# Patient Record
Sex: Male | Born: 1955 | Race: Black or African American | Hispanic: No | Marital: Married | Smoking: Current every day smoker
Health system: Southern US, Community
[De-identification: ages and names within clinical notes are randomized; demographics above are authoritative.]

## PROBLEM LIST (undated history)

## (undated) DIAGNOSIS — E785 Hyperlipidemia, unspecified: Secondary | ICD-10-CM

## (undated) DIAGNOSIS — I10 Essential (primary) hypertension: Secondary | ICD-10-CM

## (undated) DIAGNOSIS — R011 Cardiac murmur, unspecified: Secondary | ICD-10-CM

## (undated) DIAGNOSIS — I509 Heart failure, unspecified: Secondary | ICD-10-CM

## (undated) DIAGNOSIS — J45909 Unspecified asthma, uncomplicated: Secondary | ICD-10-CM

## (undated) HISTORY — DX: Heart failure, unspecified: I50.9

---

## 2010-05-22 ENCOUNTER — Emergency Department: Payer: Self-pay | Admitting: Emergency Medicine

## 2014-04-27 ENCOUNTER — Telehealth: Payer: Self-pay | Admitting: *Deleted

## 2014-04-27 MED ORDER — PREDNISONE 20 MG PO TABS: ORAL_TABLET | ORAL | Status: DC

## 2014-04-27 NOTE — Telephone Encounter (Signed)
Patient called and complained about low back pain that is radiating down his legs, especially the left leg.  Patient is traveling to [place] this Sunday.  Per Dr Oneta RackMcKeown, RX for Prednisone will be sent to [facility].

## 2014-07-03 ENCOUNTER — Other Ambulatory Visit: Payer: Self-pay | Admitting: Internal Medicine

## 2016-09-07 ENCOUNTER — Encounter: Payer: Self-pay | Admitting: Emergency Medicine

## 2016-09-07 ENCOUNTER — Emergency Department: Payer: Self-pay

## 2016-09-07 ENCOUNTER — Inpatient Hospital Stay
Admission: EM | Admit: 2016-09-07 | Discharge: 2016-09-10 | DRG: 190 | Disposition: A | Discharge status: Home / Self Care | Payer: Self-pay | Attending: Internal Medicine | Admitting: Internal Medicine

## 2016-09-07 ENCOUNTER — Inpatient Hospital Stay: Payer: Self-pay

## 2016-09-07 DIAGNOSIS — Z7982 Long term (current) use of aspirin: Secondary | ICD-10-CM

## 2016-09-07 DIAGNOSIS — I5031 Acute diastolic (congestive) heart failure: Secondary | ICD-10-CM | POA: Diagnosis present

## 2016-09-07 DIAGNOSIS — I110 Hypertensive heart disease with heart failure: Secondary | ICD-10-CM | POA: Diagnosis present

## 2016-09-07 DIAGNOSIS — J40 Bronchitis, not specified as acute or chronic: Secondary | ICD-10-CM

## 2016-09-07 DIAGNOSIS — I248 Other forms of acute ischemic heart disease: Secondary | ICD-10-CM | POA: Diagnosis present

## 2016-09-07 DIAGNOSIS — J441 Chronic obstructive pulmonary disease with (acute) exacerbation: Principal | ICD-10-CM | POA: Diagnosis present

## 2016-09-07 DIAGNOSIS — E785 Hyperlipidemia, unspecified: Secondary | ICD-10-CM | POA: Diagnosis present

## 2016-09-07 DIAGNOSIS — Z841 Family history of disorders of kidney and ureter: Secondary | ICD-10-CM

## 2016-09-07 DIAGNOSIS — Z833 Family history of diabetes mellitus: Secondary | ICD-10-CM

## 2016-09-07 DIAGNOSIS — F17210 Nicotine dependence, cigarettes, uncomplicated: Secondary | ICD-10-CM | POA: Diagnosis present

## 2016-09-07 DIAGNOSIS — Z79899 Other long term (current) drug therapy: Secondary | ICD-10-CM

## 2016-09-07 DIAGNOSIS — I272 Pulmonary hypertension, unspecified: Secondary | ICD-10-CM | POA: Diagnosis present

## 2016-09-07 DIAGNOSIS — Z8249 Family history of ischemic heart disease and other diseases of the circulatory system: Secondary | ICD-10-CM

## 2016-09-07 DIAGNOSIS — I509 Heart failure, unspecified: Secondary | ICD-10-CM

## 2016-09-07 HISTORY — DX: Essential (primary) hypertension: I10

## 2016-09-07 HISTORY — DX: Hyperlipidemia, unspecified: E78.5

## 2016-09-07 HISTORY — DX: Cardiac murmur, unspecified: R01.1

## 2016-09-07 HISTORY — DX: Unspecified asthma, uncomplicated: J45.909

## 2016-09-07 LAB — BASIC METABOLIC PANEL
ANION GAP: 5 (ref 5–15)
BUN: 12 mg/dL (ref 6–20)
CO2: 31 mmol/L (ref 22–32)
Calcium: 9.1 mg/dL (ref 8.9–10.3)
Chloride: 103 mmol/L (ref 101–111)
Creatinine, Ser: 0.94 mg/dL (ref 0.61–1.24)
GLUCOSE: 117 mg/dL — AB (ref 65–99)
POTASSIUM: 3.5 mmol/L (ref 3.5–5.1)
Sodium: 139 mmol/L (ref 135–145)

## 2016-09-07 LAB — CBC WITH DIFFERENTIAL/PLATELET
BASOS ABS: 0.1 10*3/uL (ref 0–0.1)
Basophils Relative: 1 %
EOS PCT: 3 %
Eosinophils Absolute: 0.4 10*3/uL (ref 0–0.7)
HCT: 42.1 % (ref 40.0–52.0)
Hemoglobin: 13.7 g/dL (ref 13.0–18.0)
LYMPHS PCT: 28 %
Lymphs Abs: 3.6 10*3/uL (ref 1.0–3.6)
MCH: 28.2 pg (ref 26.0–34.0)
MCHC: 32.5 g/dL (ref 32.0–36.0)
MCV: 86.8 fL (ref 80.0–100.0)
Monocytes Absolute: 0.7 10*3/uL (ref 0.2–1.0)
Monocytes Relative: 6 %
NEUTROS ABS: 8.2 10*3/uL — AB (ref 1.4–6.5)
Neutrophils Relative %: 62 %
PLATELETS: 237 10*3/uL (ref 150–440)
RBC: 4.85 MIL/uL (ref 4.40–5.90)
RDW: 14.9 % — ABNORMAL HIGH (ref 11.5–14.5)
WBC: 13.1 10*3/uL — ABNORMAL HIGH (ref 3.8–10.6)

## 2016-09-07 LAB — LIPID PANEL
CHOL/HDL RATIO: 4.9 ratio
Cholesterol: 261 mg/dL — ABNORMAL HIGH (ref 0–200)
HDL: 53 mg/dL (ref >40)
LDL Cholesterol: 194 mg/dL — ABNORMAL HIGH (ref 0–99)
Triglycerides: 71 mg/dL (ref <150)
VLDL: 14 mg/dL (ref 0–40)

## 2016-09-07 LAB — TROPONIN I
TROPONIN I: 0.05 ng/mL — AB (ref <0.03)
Troponin I: 0.03 ng/mL (ref <0.03)
Troponin I: 0.03 ng/mL (ref <0.03)

## 2016-09-07 LAB — BRAIN NATRIURETIC PEPTIDE: B NATRIURETIC PEPTIDE 5: 222 pg/mL — AB (ref 0.0–100.0)

## 2016-09-07 MED ORDER — METHYLPREDNISOLONE SODIUM SUCC 125 MG IJ SOLR
125.0000 mg | Freq: Once | INTRAMUSCULAR | Status: AC
  Administered 2016-09-07: 125 mg via INTRAVENOUS
  Filled 2016-09-07: qty 2

## 2016-09-07 MED ORDER — METOPROLOL SUCCINATE ER 50 MG PO TB24
50.0000 mg | ORAL_TABLET | Freq: Every day | ORAL | Status: DC
  Administered 2016-09-07 – 2016-09-10 (×4): 50 mg via ORAL
  Filled 2016-09-07 (×4): qty 1

## 2016-09-07 MED ORDER — HYDRALAZINE HCL 20 MG/ML IJ SOLN: 10.0000 mg | Freq: Once | INTRAMUSCULAR | Status: DC

## 2016-09-07 MED ORDER — SODIUM CHLORIDE 0.9% FLUSH
3.0000 mL | Freq: Two times a day (BID) | INTRAVENOUS | Status: DC
  Administered 2016-09-07 – 2016-09-09 (×6): 3 mL via INTRAVENOUS

## 2016-09-07 MED ORDER — IPRATROPIUM-ALBUTEROL 0.5-2.5 (3) MG/3ML IN SOLN
3.0000 mL | Freq: Once | RESPIRATORY_TRACT | Status: AC
  Administered 2016-09-07: 3 mL via RESPIRATORY_TRACT

## 2016-09-07 MED ORDER — LEVOFLOXACIN 500 MG PO TABS
500.0000 mg | ORAL_TABLET | Freq: Every day | ORAL | Status: DC
  Administered 2016-09-07 – 2016-09-10 (×4): 500 mg via ORAL
  Filled 2016-09-07 (×4): qty 1

## 2016-09-07 MED ORDER — HEPARIN SODIUM (PORCINE) 5000 UNIT/ML IJ SOLN
5000.0000 [IU] | Freq: Three times a day (TID) | INTRAMUSCULAR | Status: DC
  Administered 2016-09-07 – 2016-09-08 (×3): 5000 [IU] via SUBCUTANEOUS
  Filled 2016-09-07 (×3): qty 1

## 2016-09-07 MED ORDER — SIMVASTATIN 20 MG PO TABS
20.0000 mg | ORAL_TABLET | Freq: Every evening | ORAL | Status: DC
  Administered 2016-09-07: 20 mg via ORAL
  Filled 2016-09-07: qty 1

## 2016-09-07 MED ORDER — FUROSEMIDE 10 MG/ML IJ SOLN
40.0000 mg | Freq: Once | INTRAMUSCULAR | Status: AC
  Administered 2016-09-07: 40 mg via INTRAVENOUS
  Filled 2016-09-07: qty 4

## 2016-09-07 MED ORDER — IPRATROPIUM-ALBUTEROL 0.5-2.5 (3) MG/3ML IN SOLN
3.0000 mL | Freq: Once | RESPIRATORY_TRACT | Status: AC
  Administered 2016-09-07: 3 mL via RESPIRATORY_TRACT
  Filled 2016-09-07: qty 3

## 2016-09-07 MED ORDER — HYDRALAZINE HCL 20 MG/ML IJ SOLN: 10.0000 mg | INTRAMUSCULAR | Status: DC | PRN

## 2016-09-07 MED ORDER — FUROSEMIDE 10 MG/ML IJ SOLN
20.0000 mg | Freq: Two times a day (BID) | INTRAMUSCULAR | Status: DC
  Administered 2016-09-07 – 2016-09-09 (×4): 20 mg via INTRAVENOUS
  Filled 2016-09-07 (×4): qty 2

## 2016-09-07 MED ORDER — ENALAPRIL MALEATE 2.5 MG PO TABS
2.5000 mg | ORAL_TABLET | Freq: Every day | ORAL | Status: DC
  Administered 2016-09-07 – 2016-09-08 (×2): 2.5 mg via ORAL
  Filled 2016-09-07 (×3): qty 1

## 2016-09-07 MED ORDER — IPRATROPIUM-ALBUTEROL 0.5-2.5 (3) MG/3ML IN SOLN
RESPIRATORY_TRACT | Status: AC
  Filled 2016-09-07: qty 9

## 2016-09-07 NOTE — ED Triage Notes (Signed)
Pt presents with sob x 3 weeks with congested/productive cough that has gotten progressively worse. Pt states he has asthma but his inhaler prescription has run out. Denies COPD hx. Pt states he has had nasal congestion/URI symptoms as well. PT working to breathe.

## 2016-09-07 NOTE — ED Notes (Signed)
Dr. Huel CoteQuigley notified of troponin 0.05

## 2016-09-07 NOTE — Care Management Note (Signed)
Case Management Note  Patient Details  Name: Brent CooleyRandy Moosman MRN: [id number] Date of Birth: [birth year]  Subjective/Objective:      Spoke to the pt. At bedside while MD re-examined him. He remains wheezy. I asked him about where he usually gets his inhalers , and he admits he has not had a doctor visit or prescription for some time/ several years. He is willing to take a script he gets here , if discharged [facility] the street to [facility]. I have explained where they are located, and that I'm leaving an application on his chart, which he will get when discharged later this am. The MD is aware and hopeful for d/c after another treatment.       Action/Plan:   Expected Discharge Date:                  Expected Discharge Plan:     In-House Referral:     Discharge planning Services     Post Acute Care Choice:    Choice offered to:     DME Arranged:    DME Agency:     HH Arranged:    HH Agency:     Status of Service:     If discussed at [employer] Length of Stay Meetings, dates discussed:    Additional Comments:  Berna BueCheryl Rumeal Cullipher, RN 09/07/2016, 9:10 AM

## 2016-09-07 NOTE — ED Provider Notes (Signed)
Time Seen: Approximately 0831  I have reviewed the triage notes  Chief Complaint: Shortness of Breath   History of Present Illness: Brent Davidson is a 60 y.o. male who presents with a 3 week history of chest congestion, dry nonproductive cough which is Progressively worse. Patient states he had difficulty sleeping last night. He has had a history of asthma/COPD in the past. He states he does not have an inhaler. He denies any fever or chest pain. He states he's had some mild swelling in his extremities. No history of blood clots in his legs or lungs.   Past Medical History:  Diagnosis Date  . Asthma   . Heart murmur   . Hypertension     There are no active problems to display for this patient.   History reviewed. No pertinent surgical history.  History reviewed. No pertinent surgical history.  Current Outpatient Rx  . Order #: [id number] Class: Historical Med  . Order #: [id number] Class: Historical Med  . Order #: [id number] Class: Historical Med  . Order #: [id number] Class: Historical Med  . Order #: [id number] Class: Historical Med  . Order #: [id number] Class: Historical Med  . Order #: [id number] Class: Normal    Allergies:  Patient has no known allergies.  Family History: History reviewed. No pertinent family history.  Social History: Social History  Substance Use Topics  . Smoking status: Current Every Day Smoker    Packs/day: 0.50  . Smokeless tobacco: Never Used  . Alcohol use No     Comment: occasional     Review of Systems:   10 point review of systems was performed and was otherwise negative:  Constitutional: No Objective fever. He's had some chills and body aches Eyes: No visual disturbances ENT: No sore throat, ear pain Cardiac: No chest pain Respiratory: Increasing shortness of breath. Some audible wheezing at home Abdomen: No abdominal pain, no vomiting, No diarrhea Endocrine: No weight loss, No night sweats Extremities: No persistent peripheral  edema, cyanosis Skin: No rashes, easy bruising Neurologic: No focal weakness, trouble with speech or swollowing Urologic: No dysuria, Hematuria, or urinary frequency   Physical Exam:  ED Triage Vitals  Enc Vitals Group     BP 09/07/16 0757 (!) 193/97     Pulse Rate 09/07/16 0757 85     Resp 09/07/16 0757 (!) 22     Temp 09/07/16 0757 97.6 F (36.4 C)     Temp Source 09/07/16 0757 Oral     SpO2 09/07/16 0757 96 %     Weight 09/07/16 0757 210 lb (95.3 kg)     Height 09/07/16 0757 5\' 5"  (1.651 m)     Head Circumference --      Peak Flow --      Pain Score 09/07/16 0758 5     Pain Loc --      Pain Edu? --      Excl. in GC? --     General: Awake , Alert , and Oriented times 3; GCS 15 Tachypnea. Speaks in interrupted sentences Head: Normal cephalic , atraumatic Eyes: Pupils equal , round, reactive to light Nose/Throat: No nasal drainage, patent upper airway without erythema or exudate.  Neck: Supple, Full range of motion, No anterior adenopathy or palpable thyroid masses Lungs: Coarse breath sounds auscultated in all lung fields especially with wheezing heard bilaterally at the apices  Heart: Regular rate, regular rhythm without murmurs , gallops , or rubs Abdomen: Soft, non tender without rebound, guarding , or rigidity; bowel sounds positive and  symmetric in all 4 quadrants. No organomegaly .        Extremities: 2 plus symmetric pulses. No edema, clubbing or cyanosis Neurologic: normal ambulation, Motor symmetric without deficits, sensory intact Skin: warm, dry, no rashes   Labs:   All laboratory work was reviewed including any pertinent negatives or positives listed below:  Labs Reviewed  CBC WITH DIFFERENTIAL/PLATELET - Abnormal; Notable for the following:       Result Value   WBC 13.1 (*)    RDW 14.9 (*)    Neutro Abs 8.2 (*)    All other components within normal limits  BASIC METABOLIC PANEL - Abnormal; Notable for the following:    Glucose, Bld 117 (*)    All  other components within normal limits  BRAIN NATRIURETIC PEPTIDE - Abnormal; Notable for the following:    B Natriuretic Peptide 222.0 (*)    All other components within normal limits  TROPONIN I - Abnormal; Notable for the following:    Troponin I 0.05 (*)    All other components within normal limits  Reviewed the patient's laboratory work shows an elevated BNP and troponin  EKG:  ED ECG REPORT I, Jennye MoccasinBrian S Sairah Knobloch, the attending physician, personally viewed and interpreted this ECG.  Date: 09/07/2016 EKG Time: 0758 Rate:79 Rhythm: normal sinus rhythm QRS Axis: Left axis deviation Intervals: normal ST/T Wave abnormalities: normal Conduction Disturbances: none Narrative Interpretation: unremarkable Left ventricular hypertrophy Nonspecific T wave abnormality   Radiology:  "Dg Chest 2 View  Result Date: 09/07/2016 CLINICAL DATA:  60 year old with shortness of breath for 1 week and productive cough for 3 weeks. EXAM: CHEST  2 VIEW COMPARISON:  None. FINDINGS: Prominent interstitial lung markings bilaterally. Central vascular structures are mildly prominent. Heart size is within normal limits. Atherosclerotic calcifications at the aortic arch. No significant pleural effusions. No acute bone abnormality. IMPRESSION: Prominent interstitial lung markings bilaterally. Findings could represent interstitial pulmonary edema. No focal airspace disease. Electronically Signed   By: Richarda OverlieAdam  Henn M.D.   On: 09/07/2016 08:35  "  I personally reviewed the radiologic studies    ED Course:  Patient presented with what seemed to be a simple asthma/COPD exacerbation with a long tobacco history. He has bilateral wheezing especially at the apices with some mild rhonchi heard at the bases. The patient has some interstitial markings cited on x-ray evaluation and presented with hypertension. He was started on DuoNeb labs and further investigation shows EKG findings of some left heart strain which likely  explains the elevated troponin and BNP levels. Patient was given Lasix and his case was reviewed with the hospitalist as this seemed to be a new in for him. Patient was reexamined after his first DuoNeb and still shows some persistent coarse wheezing in all lung fields. Clinical Course      Final Clinical Impression:   Final diagnoses:  COPD exacerbation (HCC)  Pulmonary hypertension     Plan: * Inpatient management            Jennye MoccasinBrian S Savhanna Sliva, MD 09/07/16 1022

## 2016-09-07 NOTE — H&P (Signed)
[facility] - [place] [facility]   PATIENT NAME: Brent Davidson    MR#:  [id number]  DATE OF BIRTH:  [birth year]  DATE OF ADMISSION:  09/07/2016  PRIMARY CARE PHYSICIAN: No PCP Per Patient   REQUESTING/REFERRING PHYSICIAN: Huel Cote  CHIEF COMPLAINT:   Chief Complaint  Patient presents with  . Shortness of Breath    HISTORY OF PRESENT ILLNESS: Brent Davidson  is a 60 y.o. male with a known history of Htn, Hyperlipidemia, Not following with a doctor and not taking meds for last 3-4 years as he lost insurance. For 3 weeks- have cough- initially dry but later with clear to yellow sputum, getting worse. SOB with minimal exertion and on lying in bed, feels better resting while siting. ER- Xray showed some pulm edema. Given as admission. BP is uncontrolled and high.  PAST MEDICAL HISTORY:   Past Medical History:  Diagnosis Date  . Asthma   . Heart murmur   . Hyperlipidemia   . Hypertension     PAST SURGICAL HISTORY: History reviewed. No pertinent surgical history.  SOCIAL HISTORY:  Social History  Substance Use Topics  . Smoking status: Current Every Day Smoker    Packs/day: 0.50  . Smokeless tobacco: Never Used  . Alcohol use No     Comment: occasional    FAMILY HISTORY:  Family History  Problem Relation Age of Onset  . CAD Mother   . CAD Father   . Diabetes Sister   . Kidney failure Sister     DRUG ALLERGIES: No Known Allergies  REVIEW OF SYSTEMS:   CONSTITUTIONAL: No fever, fatigue or weakness.  EYES: No blurred or double vision.  EARS, NOSE, AND THROAT: No tinnitus or ear pain.  RESPIRATORY: positive for cough, shortness of breath,no wheezing or hemoptysis.  CARDIOVASCULAR: No chest pain, orthopnea, edema.  GASTROINTESTINAL: No nausea, vomiting, diarrhea or abdominal pain.  GENITOURINARY: No dysuria, hematuria.  ENDOCRINE: No polyuria, nocturia,  HEMATOLOGY: No anemia, easy bruising or bleeding SKIN: No rash or  lesion. MUSCULOSKELETAL: No joint pain or arthritis.   NEUROLOGIC: No tingling, numbness, weakness.  PSYCHIATRY: No anxiety or depression.   MEDICATIONS AT HOME:  Prior to Admission medications   Medication Sig Start Date End Date Taking? Authorizing Provider  acetaminophen (TYLENOL) 325 MG tablet Take 650 mg by mouth every 4 (four) hours as needed. 11/27/15  Yes Historical Provider, MD  albuterol (PROVENTIL HFA;VENTOLIN HFA) 108 (90 Base) MCG/ACT inhaler Inhale 2 puffs into the lungs every 4 (four) hours as needed. 11/27/15  Yes Historical Provider, MD  aspirin 81 MG chewable tablet Chew 81 mg by mouth daily. 11/27/15 11/26/16 Yes Historical Provider, MD  enalapril (VASOTEC) 2.5 MG tablet Take 2.5 mg by mouth daily. 11/27/15  Yes Historical Provider, MD  metoprolol succinate (TOPROL-XL) 50 MG 24 hr tablet Take 50 mg by mouth daily. 11/27/15  Yes Historical Provider, MD  simvastatin (ZOCOR) 40 MG tablet Take 20 mg by mouth every evening.  12/05/12  Yes Historical Provider, MD  predniSONE (DELTASONE) 20 MG tablet Take 1 tab po 3 times a day x 2 days, 1 tab po bid x 2 days and 1 tab daily x 3 days. Patient not taking: Reported on 09/07/2016 04/27/14   Lucky Cowboy, MD      PHYSICAL EXAMINATION:   VITAL SIGNS: Blood pressure (!) 146/76, pulse 79, temperature 97.6 F (36.4 C), temperature source Oral, resp. rate (!) 32, height 5\' 5"  (1.651 m), weight 95.3 kg (210 lb), SpO2 93 %.  GENERAL:  60 y.o.-year-old patient lying in the bed with no acute distress.  EYES: Pupils equal, round, reactive to light and accommodation. No scleral icterus. Extraocular muscles intact.  HEENT: Head atraumatic, normocephalic. Oropharynx and nasopharynx clear.  NECK:  Supple, no jugular venous distention. No thyroid enlargement, no tenderness.  LUNGS: Normal breath sounds bilaterally, no wheezing, some crepitation. No use of accessory muscles of respiration.  CARDIOVASCULAR: S1, S2 normal. No murmurs, rubs, or gallops.   ABDOMEN: Soft, nontender, nondistended. Bowel sounds present. No organomegaly or mass.  EXTREMITIES: No pedal edema, cyanosis, or clubbing.  NEUROLOGIC: Cranial nerves II through XII are intact. Muscle strength 5/5 in all extremities. Sensation intact. Gait not checked.  PSYCHIATRIC: The patient is alert and oriented x 3.  SKIN: No obvious rash, lesion, or ulcer.   LABORATORY PANEL:   CBC  Recent Labs Lab 09/07/16 0816  WBC 13.1*  HGB 13.7  HCT 42.1  PLT 237  MCV 86.8  MCH 28.2  MCHC 32.5  RDW 14.9*  LYMPHSABS 3.6  MONOABS 0.7  EOSABS 0.4  BASOSABS 0.1   ------------------------------------------------------------------------------------------------------------------  Chemistries   Recent Labs Lab 09/07/16 0816  NA 139  K 3.5  CL 103  CO2 31  GLUCOSE 117*  BUN 12  CREATININE 0.94  CALCIUM 9.1   ------------------------------------------------------------------------------------------------------------------ estimated creatinine clearance is 86.4 mL/min (by C-G formula based on SCr of 0.94 mg/dL). ------------------------------------------------------------------------------------------------------------------ No results for input(s): TSH, T4TOTAL, T3FREE, THYROIDAB in the last 72 hours.  Invalid input(s): FREET3   Coagulation profile No results for input(s): INR, PROTIME in the last 168 hours. ------------------------------------------------------------------------------------------------------------------- No results for input(s): DDIMER in the last 72 hours. -------------------------------------------------------------------------------------------------------------------  Cardiac Enzymes  Recent Labs Lab 09/07/16 0816  TROPONINI 0.05*   ------------------------------------------------------------------------------------------------------------------ Invalid input(s):  POCBNP  ---------------------------------------------------------------------------------------------------------------  Urinalysis No results found for: COLORURINE, APPEARANCEUR, LABSPEC, PHURINE, GLUCOSEU, HGBUR, BILIRUBINUR, KETONESUR, PROTEINUR, UROBILINOGEN, NITRITE, LEUKOCYTESUR   RADIOLOGY: Dg Chest 2 View  Result Date: 09/07/2016 CLINICAL DATA:  60 year old with shortness of breath for 1 week and productive cough for 3 weeks. EXAM: CHEST  2 VIEW COMPARISON:  None. FINDINGS: Prominent interstitial lung markings bilaterally. Central vascular structures are mildly prominent. Heart size is within normal limits. Atherosclerotic calcifications at the aortic arch. No significant pleural effusions. No acute bone abnormality. IMPRESSION: Prominent interstitial lung markings bilaterally. Findings could represent interstitial pulmonary edema. No focal airspace disease. Electronically Signed   By: Richarda OverlieAdam  Henn M.D.   On: 09/07/2016 08:35    EKG: Orders placed or performed during the hospital encounter of 09/07/16  . ED EKG  . ED EKG    IMPRESSION AND PLAN:  * Acute CHF   IV lasix, get ECHo.   He need to be set up with CHF clinic and with [facility]  * Uncontrolled Hypertension   Metoprolol and enalapril now.    May need prescriptions on discharge for few months.  * Bronchitis   Oral levaquin.  * hyperlipidemia   Check Lipid panel and get on statin.  * Active smoking   Councelled for 4 min to quit.   All the records are reviewed and case discussed with ED provider. Management plans discussed with the patient, family and they are in agreement.  CODE STATUS: full. Code Status History    This patient does not have a recorded code status. Please follow your organizational policy for patients in this situation.       TOTAL TIME TAKING CARE OF THIS PATIENT: 50 minutes.  Altamese DillingVACHHANI, Demeshia Sherburne M.D on 09/07/2016   Between 7am to 6pm - Pager -  [phone]  After 6pm go to [email or web address] - Social research officer, governmentpassword [email or web address] [email or web address]  [facility]  Office  [phone]  CC: Primary care physician; No PCP Per Patient   Note: This dictation was prepared with Dragon dictation along with smaller phrase technology. Any transcriptional errors that result from this process are unintentional.

## 2016-09-08 ENCOUNTER — Inpatient Hospital Stay (HOSPITAL_COMMUNITY)
Admit: 2016-09-08 | Discharge: 2016-09-08 | Disposition: A | Discharge status: Home / Self Care | Payer: Self-pay | Attending: Internal Medicine | Admitting: Internal Medicine

## 2016-09-08 ENCOUNTER — Inpatient Hospital Stay: Payer: Self-pay

## 2016-09-08 DIAGNOSIS — I509 Heart failure, unspecified: Secondary | ICD-10-CM

## 2016-09-08 LAB — CBC
HEMATOCRIT: 39.7 % — AB (ref 40.0–52.0)
Hemoglobin: 13 g/dL (ref 13.0–18.0)
MCH: 28.2 pg (ref 26.0–34.0)
MCHC: 32.9 g/dL (ref 32.0–36.0)
MCV: 85.7 fL (ref 80.0–100.0)
Platelets: 259 10*3/uL (ref 150–440)
RBC: 4.63 MIL/uL (ref 4.40–5.90)
RDW: 14.9 % — AB (ref 11.5–14.5)
WBC: 18.1 10*3/uL — ABNORMAL HIGH (ref 3.8–10.6)

## 2016-09-08 LAB — BASIC METABOLIC PANEL
Anion gap: 8 (ref 5–15)
BUN: 24 mg/dL — AB (ref 6–20)
CHLORIDE: 100 mmol/L — AB (ref 101–111)
CO2: 31 mmol/L (ref 22–32)
CREATININE: 1.09 mg/dL (ref 0.61–1.24)
Calcium: 9.3 mg/dL (ref 8.9–10.3)
GFR calc Af Amer: >60 mL/min (ref >60)
GFR calc non Af Amer: >60 mL/min (ref >60)
GLUCOSE: 134 mg/dL — AB (ref 65–99)
POTASSIUM: 3.5 mmol/L (ref 3.5–5.1)
Sodium: 139 mmol/L (ref 135–145)

## 2016-09-08 LAB — ECHOCARDIOGRAM COMPLETE
HEIGHTINCHES: 65 in
WEIGHTICAEL: 3385.6 [oz_av]

## 2016-09-08 LAB — HEMOGLOBIN A1C
Hgb A1c MFr Bld: 6.4 % — ABNORMAL HIGH (ref 4.8–5.6)
Mean Plasma Glucose: 137 mg/dL

## 2016-09-08 MED ORDER — IPRATROPIUM-ALBUTEROL 0.5-2.5 (3) MG/3ML IN SOLN
3.0000 mL | Freq: Four times a day (QID) | RESPIRATORY_TRACT | Status: DC
  Administered 2016-09-08 – 2016-09-10 (×9): 3 mL via RESPIRATORY_TRACT
  Filled 2016-09-08 (×9): qty 3

## 2016-09-08 MED ORDER — POTASSIUM CHLORIDE CRYS ER 20 MEQ PO TBCR
40.0000 meq | EXTENDED_RELEASE_TABLET | Freq: Once | ORAL | Status: AC
  Administered 2016-09-08: 40 meq via ORAL
  Filled 2016-09-08: qty 2

## 2016-09-08 MED ORDER — ENOXAPARIN SODIUM 40 MG/0.4ML ~~LOC~~ SOLN
40.0000 mg | SUBCUTANEOUS | Status: DC
  Administered 2016-09-08 – 2016-09-09 (×2): 40 mg via SUBCUTANEOUS
  Filled 2016-09-08 (×2): qty 0.4

## 2016-09-08 MED ORDER — ASPIRIN 81 MG PO CHEW
81.0000 mg | CHEWABLE_TABLET | Freq: Every day | ORAL | Status: DC
  Administered 2016-09-08 – 2016-09-10 (×3): 81 mg via ORAL
  Filled 2016-09-08 (×3): qty 1

## 2016-09-08 MED ORDER — GUAIFENESIN-DM 100-10 MG/5ML PO SYRP
5.0000 mL | ORAL_SOLUTION | ORAL | Status: DC | PRN
  Administered 2016-09-08 (×2): 5 mL via ORAL
  Filled 2016-09-08 (×2): qty 5

## 2016-09-08 MED ORDER — BUDESONIDE 0.5 MG/2ML IN SUSP
0.5000 mg | Freq: Two times a day (BID) | RESPIRATORY_TRACT | Status: DC
  Administered 2016-09-08 – 2016-09-10 (×5): 0.5 mg via RESPIRATORY_TRACT
  Filled 2016-09-08 (×5): qty 2

## 2016-09-08 MED ORDER — METHYLPREDNISOLONE SODIUM SUCC 40 MG IJ SOLR
40.0000 mg | Freq: Two times a day (BID) | INTRAMUSCULAR | Status: DC
  Administered 2016-09-08 – 2016-09-09 (×4): 40 mg via INTRAVENOUS
  Filled 2016-09-08 (×4): qty 1

## 2016-09-08 MED ORDER — ENALAPRIL MALEATE 10 MG PO TABS
10.0000 mg | ORAL_TABLET | Freq: Every day | ORAL | Status: DC
  Administered 2016-09-09: 10 mg via ORAL
  Filled 2016-09-08: qty 1

## 2016-09-08 NOTE — Progress Notes (Signed)
Patient ID: Brent CooleyRandy Davidson, male   DOB: [birth year], 60 y.o.   MRN: [id number]  [facility] PROGRESS NOTE  Brent CooleyRandy Maxim WUJ:[id number] DOB: [birth year] DOA: 09/07/2016 PCP: No PCP Per Patient  HPI/Subjective: Patient feeling better than yesterday. Still having some wheezing. Still with some cough. Still some shortness of breath.  Objective: Vitals:   09/08/16 0732 09/08/16 1144  BP: (!) 177/91 (!) 175/89  Pulse: 73 70  Resp: 17 14  Temp: 97.6 F (36.4 C) 98.3 F (36.8 C)    Filed Weights   09/07/16 0757 09/08/16 0413  Weight: 95.3 kg (210 lb) 96 kg (211 lb 9.6 oz)    ROS: Review of Systems  Constitutional: Negative for chills and fever.  Eyes: Negative for blurred vision.  Respiratory: Positive for cough, shortness of breath and wheezing.   Cardiovascular: Negative for chest pain.  Gastrointestinal: Negative for abdominal pain, constipation, diarrhea, nausea and vomiting.  Genitourinary: Negative for dysuria.  Musculoskeletal: Negative for joint pain.  Neurological: Negative for dizziness and headaches.   Exam: Physical Exam  HENT:  Nose: No mucosal edema.  Mouth/Throat: No oropharyngeal exudate or posterior oropharyngeal edema.  Eyes: Conjunctivae, EOM and lids are normal. Pupils are equal, round, and reactive to light.  Neck: No JVD present. Carotid bruit is not present. No edema present. No thyroid mass and no thyromegaly present.  Cardiovascular: S1 normal and S2 normal.  Exam reveals no gallop.   No murmur heard. Pulses:      Dorsalis pedis pulses are 2+ on the right side, and 2+ on the left side.  Respiratory: No respiratory distress. He has wheezes in the right middle field, the right lower field, the left upper field, the left middle field and the left lower field. He has no rhonchi. He has no rales.  GI: Soft. Bowel sounds are normal. There is no tenderness.  Musculoskeletal:       Right shoulder: He exhibits no swelling.  Lymphadenopathy:    He has  no cervical adenopathy.  Neurological: He is alert. No cranial nerve deficit.  Skin: Skin is warm. No rash noted. Nails show no clubbing.  Psychiatric: He has a normal mood and affect.      Data Reviewed: Basic Metabolic Panel:  Recent Labs Lab 09/07/16 0816 09/08/16 0432  NA 139 139  K 3.5 3.5  CL 103 100*  CO2 31 31  GLUCOSE 117* 134*  BUN 12 24*  CREATININE 0.94 1.09  CALCIUM 9.1 9.3   CBC:  Recent Labs Lab 09/07/16 0816 09/08/16 0432  WBC 13.1* 18.1*  NEUTROABS 8.2*  --   HGB 13.7 13.0  HCT 42.1 39.7*  MCV 86.8 85.7  PLT 237 259   Cardiac Enzymes:  Recent Labs Lab 09/07/16 0816 09/07/16 1229 09/07/16 2011  TROPONINI 0.05* 0.03* 0.03*   BNP (last 3 results)  Recent Labs  09/07/16 0816  BNP 222.0*      Studies: Dg Chest 2 View  Result Date: 09/08/2016 CLINICAL DATA:  CHF short of breath EXAM: CHEST  2 VIEW COMPARISON:  09/07/2016 FINDINGS: Improvement in vascular congestion and interstitial edema. No edema or effusion on today's study. Improvement in bibasilar atelectasis. IMPRESSION: Improvement in pulmonary interstitial edema. Improvement in bibasilar atelectasis. Electronically Signed   By: Marlan Palauharles  Clark M.D.   On: 09/08/2016 07:49   Dg Chest 2 View  Result Date: 09/07/2016 CLINICAL DATA:  60 year old with shortness of breath for 1 week and productive cough for 3 weeks. EXAM: CHEST  2 VIEW COMPARISON:  None. FINDINGS: Prominent interstitial lung markings bilaterally. Central vascular structures are mildly prominent. Heart size is within normal limits. Atherosclerotic calcifications at the aortic arch. No significant pleural effusions. No acute bone abnormality. IMPRESSION: Prominent interstitial lung markings bilaterally. Findings could represent interstitial pulmonary edema. No focal airspace disease. Electronically Signed   By: Richarda OverlieAdam  Henn M.D.   On: 09/07/2016 08:35    Scheduled Meds: . budesonide (PULMICORT) nebulizer solution  0.5 mg  Nebulization BID  . [START ON 09/09/2016] enalapril  10 mg Oral Daily  . furosemide  20 mg Intravenous Q12H  . heparin  5,000 Units Subcutaneous Q8H  . hydrALAZINE  10 mg Intravenous Once  . ipratropium-albuterol  3 mL Nebulization Q6H  . levofloxacin  500 mg Oral Daily  . methylPREDNISolone (SOLU-MEDROL) injection  40 mg Intravenous Q12H  . metoprolol succinate  50 mg Oral Daily  . simvastatin  20 mg Oral QPM  . sodium chloride flush  3 mL Intravenous Q12H    Assessment/Plan:  1. COPD exacerbation. Start IV Solu-Medrol budesonide and DuoNeb nebulizer solution. 2. Acute diastolic CHF. This has improved with IV Lasix. Patient also on low-dose metoprolol. 3. Tobacco abuse smoking cessation counseling done 3 minutes by me. 4. Borderline troponin likely with COPD exacerbation and CHF. 5. Care manager consultation for follow-up and [facility] and medication needs.  Code Status:     Code Status Orders        Start     Ordered   09/07/16 1343  Full code  Continuous     09/07/16 1342    Code Status History    Date Active Date Inactive Code Status Order ID Comments User Context   This patient has a current code status but no historical code status.      Disposition Plan: Potentially home tomorrow if breathing better  Antibiotics:  Levaquin  Time spent: 25 minutes  Alford HighlandWIETING, Banjamin Stovall  [employer]

## 2016-09-08 NOTE — Plan of Care (Signed)
Problem: Physical Regulation: Goal: Will remain free from infection Outcome: Progressing Po antibiotics  Problem: Tissue Perfusion: Goal: Risk factors for ineffective tissue perfusion will decrease Outcome: Progressing SQ Heparin  Problem: Cardiac: Goal: Ability to achieve and maintain adequate cardiopulmonary perfusion will improve Outcome: Progressing IV lasix, daily weights, strict I&O

## 2016-09-08 NOTE — Progress Notes (Signed)
*  PRELIMINARY RESULTS* Echocardiogram 2D Echocardiogram has been performed.  Cristela BlueHege, Shenae Bonanno 09/08/2016, 11:48 AM

## 2016-09-08 NOTE — Care Management Note (Signed)
Case Management Note  Patient Details  Name: Brent Davidson MRN: [id number] Date of Birth: 1956-06-19  Subjective/Objective:                   Spoke with patient to discuss discharge planning. He states a friend lives with him and they both make less that $2500/month. He is independent with mobility and able to drive (not homebound). He does not have a PCP nor does he take any medications "because he can't afford them". He does not have health insurance. He agrees to referral to [facility] and Medication Management. I confirmed with patient that his contact number and address is correct this visit.  Action/Plan:  Referrals to Medication management and [facility].   Expected Discharge Date:  09/08/16               Expected Discharge Plan:     In-House Referral:     Discharge planning Services  CM Consult, Medication Assistance, Indigent Health Clinic  Post Acute Care Choice:    Choice offered to:  Patient  DME Arranged:    DME Agency:     HH Arranged:    HH Agency:     Status of Service:  In process, will continue to follow  If discussed at Long Length of Stay Meetings, dates discussed:    Additional Comments:  Brent Siadngela Coe Angelos, RN 09/08/2016, 11:01 AM

## 2016-09-08 NOTE — Progress Notes (Signed)
To xray via bed 

## 2016-09-08 NOTE — Progress Notes (Signed)
Nutrition Brief Note  Patient identified on the Malnutrition Screening Tool (MST) Report  Wt Readings from Last 15 Encounters:  09/08/16 211 lb 9.6 oz (96 kg)    Body mass index is 35.21 kg/m. Patient meets criteria for obese based on current BMI.   Current diet order is heart healthy, patient is consuming approximately 100% of meals at this time. Stable wts per pt. Labs and medications reviewed.   No nutrition interventions warranted at this time. If nutrition issues arise, please consult RD.   Brent Holidayasey Laniya Davidson, RD, LDN Pager #- [phone]

## 2016-09-09 MED ORDER — FUROSEMIDE 40 MG PO TABS
40.0000 mg | ORAL_TABLET | Freq: Every day | ORAL | Status: DC
  Administered 2016-09-10: 40 mg via ORAL
  Filled 2016-09-09: qty 1

## 2016-09-09 MED ORDER — ENALAPRIL MALEATE 10 MG PO TABS
10.0000 mg | ORAL_TABLET | Freq: Two times a day (BID) | ORAL | Status: DC
  Administered 2016-09-09 – 2016-09-10 (×2): 10 mg via ORAL
  Filled 2016-09-09 (×2): qty 1

## 2016-09-09 NOTE — Progress Notes (Signed)
Patient ID: Brent CooleyRandy Davidson, male   DOB: 13-Nov-1955, 60 y.o.   MRN: [id number]  [facility] PROGRESS NOTE  Brent CooleyRandy Davidson WUJ:[id number] DOB: 13-Nov-1955 DOA: 09/07/2016 PCP: No PCP Per Patient  HPI/Subjective: Patient not feeling well. Still wheezing and coughing.  Objective: Vitals:   09/09/16 0800 09/09/16 1115  BP: (!) 176/90 (!) 154/54  Pulse: 81 73  Resp: 18 17  Temp:  97.8 F (36.6 C)    Filed Weights   09/07/16 0757 09/08/16 0413 09/09/16 0533  Weight: 95.3 kg (210 lb) 96 kg (211 lb 9.6 oz) 97.4 kg (214 lb 11.2 oz)    ROS: Review of Systems  Constitutional: Negative for chills and fever.  Eyes: Negative for blurred vision.  Respiratory: Positive for cough, shortness of breath and wheezing.   Cardiovascular: Negative for chest pain.  Gastrointestinal: Negative for abdominal pain, constipation, diarrhea, nausea and vomiting.  Genitourinary: Negative for dysuria.  Musculoskeletal: Negative for joint pain.  Neurological: Negative for dizziness and headaches.   Exam: Physical Exam  HENT:  Nose: No mucosal edema.  Mouth/Throat: No oropharyngeal exudate or posterior oropharyngeal edema.  Eyes: Conjunctivae, EOM and lids are normal. Pupils are equal, round, and reactive to light.  Neck: No JVD present. Carotid bruit is not present. No edema present. No thyroid mass and no thyromegaly present.  Cardiovascular: S1 normal and S2 normal.  Exam reveals no gallop.   No murmur heard. Pulses:      Dorsalis pedis pulses are 2+ on the right side, and 2+ on the left side.  Respiratory: No respiratory distress. He has wheezes in the right middle field, the right lower field, the left upper field, the left middle field and the left lower field. He has no rhonchi. He has no rales.  GI: Soft. Bowel sounds are normal. There is no tenderness.  Musculoskeletal:       Right shoulder: He exhibits no swelling.  Lymphadenopathy:    He has no cervical adenopathy.  Neurological: He is  alert. No cranial nerve deficit.  Skin: Skin is warm. No rash noted. Nails show no clubbing.  Psychiatric: He has a normal mood and affect.      Data Reviewed: Basic Metabolic Panel:  Recent Labs Lab 09/07/16 0816 09/08/16 0432  NA 139 139  K 3.5 3.5  CL 103 100*  CO2 31 31  GLUCOSE 117* 134*  BUN 12 24*  CREATININE 0.94 1.09  CALCIUM 9.1 9.3   CBC:  Recent Labs Lab 09/07/16 0816 09/08/16 0432  WBC 13.1* 18.1*  NEUTROABS 8.2*  --   HGB 13.7 13.0  HCT 42.1 39.7*  MCV 86.8 85.7  PLT 237 259   Cardiac Enzymes:  Recent Labs Lab 09/07/16 0816 09/07/16 1229 09/07/16 2011  TROPONINI 0.05* 0.03* 0.03*   BNP (last 3 results)  Recent Labs  09/07/16 0816  BNP 222.0*      Studies: Dg Chest 2 View  Result Date: 09/08/2016 CLINICAL DATA:  CHF short of breath EXAM: CHEST  2 VIEW COMPARISON:  09/07/2016 FINDINGS: Improvement in vascular congestion and interstitial edema. No edema or effusion on today's study. Improvement in bibasilar atelectasis. IMPRESSION: Improvement in pulmonary interstitial edema. Improvement in bibasilar atelectasis. Electronically Signed   By: Marlan Palauharles  Clark M.D.   On: 09/08/2016 07:49    Scheduled Meds: . aspirin  81 mg Oral Daily  . budesonide (PULMICORT) nebulizer solution  0.5 mg Nebulization BID  . enalapril  10 mg Oral BID  . enoxaparin (LOVENOX) injection  40  mg Subcutaneous Q24H  . [START ON 09/10/2016] furosemide  40 mg Oral Daily  . hydrALAZINE  10 mg Intravenous Once  . ipratropium-albuterol  3 mL Nebulization Q6H  . levofloxacin  500 mg Oral Daily  . methylPREDNISolone (SOLU-MEDROL) injection  40 mg Intravenous Q12H  . metoprolol succinate  50 mg Oral Daily  . sodium chloride flush  3 mL Intravenous Q12H    Assessment/Plan:  1. COPD exacerbation. Continue IV Solu-Medrol budesonide and DuoNeb nebulizer solution. 2. Acute diastolic CHF. This has improved with IV Lasix. Switch Lasix over to oral for tomorrow. Patient also  on low-dose metoprolol. 3. Tobacco abuse. 4. Borderline troponin likely with COPD exacerbation and CHF. 5. Care manager consultation for [facility] and [facility] and medication needs.  Code Status:     Code Status Orders        Start     Ordered   09/07/16 1343  Full code  Continuous     09/07/16 1342    Code Status History    Date Active Date Inactive Code Status Order ID Comments User Context   This patient has a current code status but no historical code status.      Disposition Plan: Reevaluate daily for potential disposition  Antibiotics:  Levaquin  Time spent: 24 minutes  Alford HighlandWIETING, Oluwadamilola Rosamond  [employer]

## 2016-09-09 NOTE — Progress Notes (Signed)
Initial [facility] Failure Clinic appointment scheduled for September 21, 2016 at 1:00pm. Thank you.

## 2016-09-10 LAB — BASIC METABOLIC PANEL
ANION GAP: 7 (ref 5–15)
BUN: 27 mg/dL — AB (ref 6–20)
CHLORIDE: 103 mmol/L (ref 101–111)
CO2: 29 mmol/L (ref 22–32)
Calcium: 9.1 mg/dL (ref 8.9–10.3)
Creatinine, Ser: 1.12 mg/dL (ref 0.61–1.24)
GFR calc Af Amer: >60 mL/min (ref >60)
GLUCOSE: 154 mg/dL — AB (ref 65–99)
POTASSIUM: 3.8 mmol/L (ref 3.5–5.1)
Sodium: 139 mmol/L (ref 135–145)

## 2016-09-10 MED ORDER — DOXYCYCLINE HYCLATE 100 MG PO TABS: 100.0000 mg | ORAL_TABLET | Freq: Two times a day (BID) | ORAL | Status: DC

## 2016-09-10 MED ORDER — DOXYCYCLINE HYCLATE 100 MG PO TABS: 100.0000 mg | ORAL_TABLET | Freq: Two times a day (BID) | ORAL | 0 refills | Status: DC

## 2016-09-10 MED ORDER — PREDNISONE 20 MG PO TABS
40.0000 mg | ORAL_TABLET | Freq: Once | ORAL | Status: AC
  Administered 2016-09-10: 40 mg via ORAL
  Filled 2016-09-10: qty 2

## 2016-09-10 MED ORDER — ALBUTEROL SULFATE HFA 108 (90 BASE) MCG/ACT IN AERS: 2.0000 | INHALATION_SPRAY | RESPIRATORY_TRACT | 0 refills | Status: DC | PRN

## 2016-09-10 MED ORDER — METOPROLOL SUCCINATE ER 50 MG PO TB24: 50.0000 mg | ORAL_TABLET | Freq: Every day | ORAL | 0 refills | Status: DC

## 2016-09-10 MED ORDER — LABETALOL HCL 5 MG/ML IV SOLN: 10.0000 mg | INTRAVENOUS | Status: DC | PRN

## 2016-09-10 MED ORDER — PREDNISONE 5 MG PO TABS: ORAL_TABLET | ORAL | 0 refills | Status: DC

## 2016-09-10 MED ORDER — HYDROCOD POLST-CPM POLST ER 10-8 MG/5ML PO SUER
5.0000 mL | Freq: Once | ORAL | Status: AC
  Administered 2016-09-10: 5 mL via ORAL
  Filled 2016-09-10: qty 5

## 2016-09-10 MED ORDER — PREDNISOLONE 5 MG PO TABS
40.0000 mg | ORAL_TABLET | Freq: Every day | ORAL | Status: DC
  Filled 2016-09-10: qty 8

## 2016-09-10 MED ORDER — IPRATROPIUM-ALBUTEROL 0.5-2.5 (3) MG/3ML IN SOLN
3.0000 mL | Freq: Once | RESPIRATORY_TRACT | Status: AC
  Administered 2016-09-10: 3 mL via RESPIRATORY_TRACT
  Filled 2016-09-10: qty 3

## 2016-09-10 MED ORDER — ENALAPRIL MALEATE 10 MG PO TABS: 10.0000 mg | ORAL_TABLET | Freq: Two times a day (BID) | ORAL | 0 refills | Status: DC

## 2016-09-10 MED ORDER — FUROSEMIDE 40 MG PO TABS: 40.0000 mg | ORAL_TABLET | Freq: Every day | ORAL | 0 refills | Status: DC

## 2016-09-10 MED ORDER — BECLOMETHASONE DIPROPIONATE 40 MCG/ACT IN AERS: 1.0000 | INHALATION_SPRAY | Freq: Two times a day (BID) | RESPIRATORY_TRACT | 0 refills | Status: DC

## 2016-09-10 NOTE — Discharge Instructions (Signed)
Chronic Obstructive Pulmonary Disease Exacerbation Chronic obstructive pulmonary disease (COPD) is a common lung problem. In COPD, the flow of air from the lungs is limited. COPD exacerbations are times that breathing gets worse and you need extra treatment. Without treatment they can be life threatening. If they happen often, your lungs can become more damaged. If your COPD gets worse, your doctor may treat you with:  Medicines.  Oxygen.  Different ways to clear your airway, such as using a mask. Follow these instructions at home:  Do not smoke.  Avoid tobacco smoke and other things that bother your lungs.  If given, take your antibiotic medicine as told. Finish the medicine even if you start to feel better.  Only take medicines as told by your doctor.  Drink enough fluids to keep your pee (urine) clear or pale yellow (unless your doctor has told you not to).  Use a cool mist machine (vaporizer).  If you use oxygen or a machine that turns liquid medicine into a mist (nebulizer), continue to use them as told.  Keep up with shots (vaccinations) as told by your doctor.  Exercise regularly.  Eat healthy foods.  Keep all doctor visits as told. Get help right away if:  You are very short of breath and it gets worse.  You have trouble talking.  You have bad chest pain.  You have blood in your spit (sputum).  You have a fever.  You keep throwing up (vomiting).  You feel weak, or you pass out (faint).  You feel confused.  You keep getting worse. This information is not intended to replace advice given to you by your health care provider. Make sure you discuss any questions you have with your health care provider. Document Released: 08/20/2011 Document Revised: 02/06/2016 Document Reviewed: 05/05/2013 Elsevier Interactive Patient Education  2017 Elsevier Inc.   Heart Failure Heart failure means your heart has trouble pumping blood. This makes it hard for your body  to work well. Heart failure is usually a long-term (chronic) condition. You must take good care of yourself and follow your doctor's treatment plan. HOME CARE  Take your heart medicine as told by your doctor.  Do not stop taking medicine unless your doctor tells you to.  Do not skip any dose of medicine.  Refill your medicines before they run out.  Take other medicines only as told by your doctor or pharmacist.  Stay active if told by your doctor. The elderly and people with severe heart failure should talk with a doctor about physical activity.  Eat heart-healthy foods. Choose foods that are without trans fat and are low in saturated fat, cholesterol, and salt (sodium). This includes fresh or frozen fruits and vegetables, fish, lean meats, fat-free or low-fat dairy foods, whole grains, and high-fiber foods. Lentils and dried peas and beans (legumes) are also good choices.  Limit salt if told by your doctor.  Cook in a healthy way. Roast, grill, broil, bake, poach, steam, or stir-fry foods.  Limit fluids as told by your doctor.  Weigh yourself every morning. Do this after you pee (urinate) and before you eat breakfast. Write down your weight to give to your doctor.  Take your blood pressure and write it down if your doctor tells you to.  Ask your doctor how to check your pulse. Check your pulse as told.  Lose weight if told by your doctor.  Stop smoking or chewing tobacco. Do not use gum or patches that help you quit  without your doctor's approval.  Schedule and go to doctor visits as told.  Nonpregnant women should have no more than 1 drink a day. Men should have no more than 2 drinks a day. Talk to your doctor about drinking alcohol.  Stop illegal drug use.  Stay current with shots (immunizations).  Manage your health conditions as told by your doctor.  Learn to manage your stress.  Rest when you are tired.  If it is really hot outside:  Avoid intense  activities.  Use air conditioning or fans, or get in a cooler place.  Avoid caffeine and alcohol.  Wear loose-fitting, lightweight, and light-colored clothing.  If it is really cold outside:  Avoid intense activities.  Layer your clothing.  Wear mittens or gloves, a hat, and a scarf when going outside.  Avoid alcohol.  Learn about heart failure and get support as needed.  Get help to maintain or improve your quality of life and your ability to care for yourself as needed. GET HELP IF:   You gain weight quickly.  You are more short of breath than usual.  You cannot do your normal activities.  You tire easily.  You cough more than normal, especially with activity.  You have any or more puffiness (swelling) in areas such as your hands, feet, ankles, or belly (abdomen).  You cannot sleep because it is hard to breathe.  You feel like your heart is beating fast (palpitations).  You get dizzy or light-headed when you stand up. GET HELP RIGHT AWAY IF:   You have trouble breathing.  There is a change in mental status, such as becoming less alert or not being able to focus.  You have chest pain or discomfort.  You faint. MAKE SURE YOU:   Understand these instructions.  Will watch your condition.  Will get help right away if you are not doing well or get worse. This information is not intended to replace advice given to you by your health care provider. Make sure you discuss any questions you have with your health care provider. Document Released: 06/09/2008 Document Revised: 09/21/2014 Document Reviewed: 10/17/2012 Elsevier Interactive Patient Education  2017 Elsevier Inc. Heart Failure Clinic appointment on September 21, 2016 at 1:00pm with Clarisa Kindredina Hackney, FNP. Please call [phone] to reschedule.

## 2016-09-10 NOTE — Care Management (Signed)
Patient verbally confirmed that he has been given the [facility].  Discussed the need to follow through with completion of the application process.  Spoke with [facility] and attending regarding substitution of Qvar.  Faxed all scripts to the [facility] and instructed patient on picking them up at discharge.  Verbalized understnading

## 2016-09-10 NOTE — Progress Notes (Signed)
A&O. Independent. Notified Dr. Anne HahnWillis of 7 beat run of SVT. Will continue to monitor.

## 2016-09-10 NOTE — Discharge Summary (Signed)
[facility] - [place] at [facility]   PATIENT NAME: Brent Davidson Slotnick    MR#:  [id number]  DATE OF BIRTH:  April 22, 1956  DATE OF ADMISSION:  09/07/2016 ADMITTING PHYSICIAN: Altamese DillingVaibhavkumar Vachhani, MD  DATE OF DISCHARGE: 09/10/2016  2:27 PM  PRIMARY CARE PHYSICIAN: [facility]   ADMISSION DIAGNOSIS:  CHF (congestive heart failure) (HCC) [I50.9] Pulmonary hypertension [I27.20] COPD exacerbation (HCC) [J44.1]  DISCHARGE DIAGNOSIS:  Principal Problem:   CHF (congestive heart failure) (HCC) Active Problems:   Bronchitis   SECONDARY DIAGNOSIS:   Past Medical History:  Diagnosis Date  . Asthma   . Heart murmur   . Hyperlipidemia   . Hypertension     HOSPITAL COURSE:   1. COPD exacerbation. Patient was started on IV Solu-Medrol, budesonide nebulizers and DuoNeb nebulizers. Patient had improved with better air entry and less wheezing. Still had slight expiratory wheezing at the bases only. He was feeling much better and wanted to get out of the hospital. Albuterol inhaler and Qvar inhaler prescribed. Prednisone taper upon going home. Finish up course of antibiotic with doxycycline. 2. Acute diastolic congestive heart failure. Patient was diuresed with IV Lasix and switched over to oral Lasix. Patient also on low-dose metoprolol and enalapril. Follow-up appointment at the CHF clinic given. 3. Accelerated hypertension patient was placed on enalapril and increase dose and metoprolol. 4. Tobacco abuse patient was advised not to smoke. 5. Elevated troponin. This is demand ischemia from respiratory symptoms.  DISCHARGE CONDITIONS:   Satisfactory  CONSULTS OBTAINED:   None DRUG ALLERGIES:  No Known Allergies  DISCHARGE MEDICATIONS:   Discharge Medication List as of 09/10/2016 12:11 PM    START taking these medications   Details  beclomethasone (QVAR) 40 MCG/ACT inhaler Inhale 1 puff into the lungs 2 (two) times daily., Starting Thu 09/10/2016, Print     doxycycline (VIBRA-TABS) 100 MG tablet Take 1 tablet (100 mg total) by mouth every 12 (twelve) hours., Starting Fri 09/11/2016, Print    furosemide (LASIX) 40 MG tablet Take 1 tablet (40 mg total) by mouth daily., Starting Thu 09/10/2016, Print      CONTINUE these medications which have CHANGED   Details  albuterol (PROVENTIL HFA;VENTOLIN HFA) 108 (90 Base) MCG/ACT inhaler Inhale 2 puffs into the lungs every 4 (four) hours as needed., Starting Thu 09/10/2016, Print    enalapril (VASOTEC) 10 MG tablet Take 1 tablet (10 mg total) by mouth 2 (two) times daily., Starting Thu 09/10/2016, Print    metoprolol succinate (TOPROL-XL) 50 MG 24 hr tablet Take 1 tablet (50 mg total) by mouth daily., Starting Thu 09/10/2016, Print    predniSONE (DELTASONE) 5 MG tablet Take 6 tabs po day1; take 5 tabs po day2; Take 4 tabs po day3; Take 3 tabs po day4; take 2 tabs po day5; take 1 tab po day6, Print      CONTINUE these medications which have NOT CHANGED   Details  aspirin 81 MG chewable tablet Chew 81 mg by mouth daily., Starting Wed 11/27/2015, Until Thu 11/26/2016, Historical Med      STOP taking these medications     acetaminophen (TYLENOL) 325 MG tablet      simvastatin (ZOCOR) 40 MG tablet          DISCHARGE INSTRUCTIONS:   [facility] Follow-up at the [facility]F clinic  If you experience worsening of your admission symptoms, develop shortness of breath, life threatening emergency, suicidal or homicidal thoughts you must seek medical attention immediately by  calling 911 or calling your MD immediately  if symptoms less severe.  You Must read complete instructions/literature along with all the possible adverse reactions/side effects for all the Medicines you take and that have been prescribed to you. Take any new Medicines after you have completely understood and accept all the possible adverse reactions/side effects.   Please note  You were cared for by a hospitalist  during your hospital stay. If you have any questions about your discharge medications or the care you received while you were in the hospital after you are discharged, you can call the unit and asked to speak with the hospitalist on call if the hospitalist that took care of you is not available. Once you are discharged, your primary care physician will handle any further medical issues. Please note that NO REFILLS for any discharge medications will be authorized once you are discharged, as it is imperative that you return to your primary care physician (or establish a relationship with a primary care physician if you do not have one) for your aftercare needs so that they can reassess your need for medications and monitor your lab values.    Today   CHIEF COMPLAINT:   Chief Complaint  Patient presents with  . Shortness of Breath    HISTORY OF PRESENT ILLNESS:  Brent Davidson Fickle  is a 60 y.o. male  presented with shortness of breath and found to have COPD exacerbation CHF.   VITAL SIGNS:  Blood pressure (!) 160/82, pulse 75, temperature 98.3 F (36.8 C), resp. rate 18, height 5\' 5"  (1.651 m), weight 96.8 kg (213 lb 8 oz), SpO2 96 %.   PHYSICAL EXAMINATION:  GENERAL:  60 y.o.-year-old patient lying in the bed with no acute distress.  EYES: Pupils equal, round, reactive to light and accommodation. No scleral icterus. Extraocular muscles intact.  HEENT: Head atraumatic, normocephalic. Oropharynx and nasopharynx clear.  NECK:  Supple, no jugular venous distention. No thyroid enlargement, no tenderness.  LUNGS: Normal breath sounds bilaterally, Slight expiratory wheezing at the bases. No rales,rhonchi or crepitation. No use of accessory muscles of respiration.  CARDIOVASCULAR: S1, S2 normal. No murmurs, rubs, or gallops.  ABDOMEN: Soft, non-tender, non-distended. Bowel sounds present. No organomegaly or mass.  EXTREMITIES: No pedal edema, cyanosis, or clubbing.  NEUROLOGIC: Cranial nerves II  through XII are intact. Muscle strength 5/5 in all extremities. Sensation intact. Gait not checked.  PSYCHIATRIC: The patient is alert and oriented x 3.  SKIN: No obvious rash, lesion, or ulcer.   DATA REVIEW:   CBC  Recent Labs Lab 09/08/16 0432  WBC 18.1*  HGB 13.0  HCT 39.7*  PLT 259    Chemistries   Recent Labs Lab 09/10/16 0505  NA 139  K 3.8  CL 103  CO2 29  GLUCOSE 154*  BUN 27*  CREATININE 1.12  CALCIUM 9.1    Cardiac Enzymes  Recent Labs Lab 09/07/16 2011  TROPONINI 0.03*   Management plans discussed with the patient, family and they are in agreement.  CODE STATUS:  Code Status History    Date Active Date Inactive Code Status Order ID Comments User Context   09/07/2016  1:42 PM 09/10/2016  5:32 PM Full Code [id number]  Altamese DillingVaibhavkumar Vachhani, MD ED      TOTAL TIME TAKING CARE OF THIS PATIENT: 35 minutes.    Alford HighlandWIETING, Mostyn Varnell M.D on 09/10/2016 at 5:41 PM  Between 7am to 6pm - Pager - [phone]  After 6pm go to [email or web address] - password [email or web address]  [facility] Office  [phone]  CC: Primary care physician; [facility]

## 2016-09-10 NOTE — Progress Notes (Signed)
Patient ID: Brent CooleyRandy Gallaway, male   DOB: 06-06-1956, 60 y.o.   MRN: [id number] [facility] - [place] at [facility]        Brent CooleyRandy Blas was admitted to the Hospital on 09/07/2016 and Discharged  09/10/2016 and should be excused from work/school   for 4 days starting 09/07/2016 , may return to work/school without any restrictions.  Alford HighlandWIETING, Brigetta Beckstrom M.D on 09/10/2016,at 8:18 AM  [facility] - [place] at [facility]    Office  [phone]

## 2016-09-10 NOTE — Progress Notes (Signed)
MD notified. Pt is to be discharged. Pt has audible wheeze and frequent productive cough. Orders for one time dose of tussinex and one time dose of svn albuterol. I will continue to assess.

## 2016-09-21 ENCOUNTER — Ambulatory Visit: Payer: Self-pay | Admitting: Family

## 2016-09-24 ENCOUNTER — Ambulatory Visit: Payer: Self-pay | Attending: Family | Admitting: Family

## 2016-09-24 ENCOUNTER — Encounter: Payer: Self-pay | Admitting: Family

## 2016-09-24 VITALS — BP 131/73 | HR 85 | Resp 18 | Ht 66.0 in | Wt 215.2 lb

## 2016-09-24 DIAGNOSIS — F17210 Nicotine dependence, cigarettes, uncomplicated: Secondary | ICD-10-CM | POA: Insufficient documentation

## 2016-09-24 DIAGNOSIS — Z7982 Long term (current) use of aspirin: Secondary | ICD-10-CM | POA: Insufficient documentation

## 2016-09-24 DIAGNOSIS — Z79899 Other long term (current) drug therapy: Secondary | ICD-10-CM | POA: Insufficient documentation

## 2016-09-24 DIAGNOSIS — Z720 Tobacco use: Secondary | ICD-10-CM

## 2016-09-24 DIAGNOSIS — I5032 Chronic diastolic (congestive) heart failure: Secondary | ICD-10-CM | POA: Insufficient documentation

## 2016-09-24 DIAGNOSIS — I10 Essential (primary) hypertension: Secondary | ICD-10-CM

## 2016-09-24 DIAGNOSIS — I110 Hypertensive heart disease with heart failure: Secondary | ICD-10-CM | POA: Insufficient documentation

## 2016-09-24 NOTE — Patient Instructions (Addendum)
Continue weighing daily and call for an overnight weight gain of > 2 pounds or a weekly weight gain of >5 pounds.  Call [facility] at [phone] to request a new patient appointment.

## 2016-09-24 NOTE — Progress Notes (Signed)
Patient ID: Brent Davidson, male    DOB: 04/19/1956, 61 y.o.   MRN: [id number]  HPI  [name] is a 61 y/o male with a history of HTN, hyperlipidemia, murmur, asthma, current tobacco use and chronic heart failure.  Last echo was done on 09/08/16 and showed an EF of 55-60% along with mild aortic stenosis and mild MR.   Most recently admitted on 09/07/16 with HF exacerbation and bronchitis. Was treated with IV steroids, nebulizers, inhalers, and discharged with prednisone taper and antibiotics. Was also given IV diuretics.   He presents today for his initial visit with fatigue upon moderate exertion. Denies any shortness of breath or swelling in his legs/abdomen. Already weighing himself daily. Does feel like he's sleeping well and wakes up feeling rested. Currently doesn't have any medical insurance.   Past Medical History:  Diagnosis Date  . Asthma   . CHF (congestive heart failure) (HCC)   . Heart murmur   . Hyperlipidemia   . Hypertension    No past surgical history on file.  Family History  Problem Relation Age of Onset  . CAD Mother   . CAD Father   . Diabetes Sister   . Kidney failure Sister    Social History  Substance Use Topics  . Smoking status: Current Every Day Smoker    Packs/day: 0.50  . Smokeless tobacco: Never Used  . Alcohol use No     Comment: occasional   No Known Allergies  Prior to Admission medications   Medication Sig Start Date End Date Taking? Authorizing Provider  albuterol (PROVENTIL HFA;VENTOLIN HFA) 108 (90 Base) MCG/ACT inhaler Inhale 2 puffs into the lungs every 4 (four) hours as needed. 09/10/16  Yes Alford Highlandichard Wieting, MD  aspirin 81 MG chewable tablet Chew 81 mg by mouth daily. 11/27/15 11/26/16 Yes Historical Provider, MD  beclomethasone (QVAR) 40 MCG/ACT inhaler Inhale 1 puff into the lungs 2 (two) times daily. 09/10/16  Yes Richard Renae GlossWieting, MD  enalapril (VASOTEC) 10 MG tablet Take 1 tablet (10 mg total) by mouth 2 (two) times daily.  09/10/16  Yes Alford Highlandichard Wieting, MD  furosemide (LASIX) 40 MG tablet Take 1 tablet (40 mg total) by mouth daily. 09/10/16  Yes Alford Highlandichard Wieting, MD  metoprolol succinate (TOPROL-XL) 50 MG 24 hr tablet Take 1 tablet (50 mg total) by mouth daily. 09/10/16  Yes Richard Renae GlossWieting, MD  predniSONE (DELTASONE) 5 MG tablet Take 6 tabs po day1; take 5 tabs po day2; Take 4 tabs po day3; Take 3 tabs po day4; take 2 tabs po day5; take 1 tab po day6 09/10/16  Yes Alford Highlandichard Wieting, MD    Review of Systems  Constitutional: Positive for fatigue. Negative for appetite change.  HENT: Negative for congestion, rhinorrhea and sore throat.   Eyes: Negative.   Respiratory: Negative for cough, chest tightness and shortness of breath.   Cardiovascular: Negative for chest pain, palpitations and leg swelling.  Gastrointestinal: Negative for abdominal distention and abdominal pain.  Endocrine: Negative.   Genitourinary: Negative.   Musculoskeletal: Positive for arthralgias (left knee & right ankle). Negative for back pain.  Skin: Negative.   Allergic/Immunologic: Negative.   Neurological: Negative for dizziness and light-headedness.  Hematological: Negative for adenopathy. Does not bruise/bleed easily.  Psychiatric/Behavioral: Negative for dysphoric mood, sleep disturbance (sleeping on 1 pillow) and suicidal ideas. The patient is not nervous/anxious.    Vitals:   09/24/16 1214  BP: 131/73  Pulse: 85  Resp: 18  SpO2: 100%  Weight: 215 lb 4  oz (97.6 kg)  Height: 5\' 6"  (1.676 m)   Wt Readings from Last 3 Encounters:  09/24/16 215 lb 4 oz (97.6 kg)  09/10/16 213 lb 8 oz (96.8 kg)   Lab Results  Component Value Date   CREATININE 1.12 09/10/2016   CREATININE 1.09 09/08/2016   CREATININE 0.94 09/07/2016   Physical Exam  Constitutional: He is oriented to person, place, and time. He appears well-developed and well-nourished.  HENT:  Head: Normocephalic and atraumatic.  Eyes: Conjunctivae are normal. Pupils are  equal, round, and reactive to light.  Neck: Normal range of motion. Neck supple. No JVD present.  Cardiovascular: Normal rate and regular rhythm.   Murmur heard.  Systolic murmur is present with a grade of 2/6  Pulmonary/Chest: Effort normal. He has no wheezes. He has no rales.  Abdominal: Soft. He exhibits no distension. There is no tenderness.  Musculoskeletal: He exhibits no edema or tenderness.  Neurological: He is alert and oriented to person, place, and time.  Skin: Skin is warm and dry.  Psychiatric: He has a normal mood and affect. His behavior is normal. Thought content normal.  Nursing note and vitals reviewed.  Assessment & Plan:  1: Chronic heart failure with preserved ejection fraction- - NYHA class II - euvolemic - already weighing daily. Instructed to call for an overnight weight gain of >2 pounds or a weekly weight gain of >5 pounds. - not adding salt to his food. Reviewed the importance of following a 2000mg  sodium diet and written information was given to him about this.  - [facility] get [facility] on 09/29/16  2: HTN- - BP looks good today - phone number to [facility] given to patient and instructed him to call so that they could mail him the paperwork.  3: Tobacco use- - patient smokes about 1/2 ppd of cigarettes and doesn't have a desire to quit at this time - complete cessation discussed for 3 minutes with him  Return here in 1 month or sooner for any questions/problems before then.

## 2016-09-28 DIAGNOSIS — I10 Essential (primary) hypertension: Secondary | ICD-10-CM | POA: Insufficient documentation

## 2016-09-28 DIAGNOSIS — Z720 Tobacco use: Secondary | ICD-10-CM | POA: Insufficient documentation

## 2016-09-29 ENCOUNTER — Ambulatory Visit: Payer: Self-pay | Admitting: Pharmacy Technician

## 2016-09-29 NOTE — Progress Notes (Signed)
Patient scheduled for eligibility appointment at [facility].  Patient cancelled appt for 09/29/16 at 10:30a.m.  Appt rescheduled to 10/27/16 at 2:00p.m.  Brent DacostaBetty J. Teriyah Davidson Care Manager [facility]

## 2016-10-20 ENCOUNTER — Telehealth: Payer: Self-pay | Admitting: Family

## 2016-10-20 ENCOUNTER — Ambulatory Visit: Payer: Self-pay | Admitting: Family

## 2016-10-20 NOTE — Telephone Encounter (Signed)
Patient did not show for his [facility] appointment on 10/20/16. Will attempt to reschedule.

## 2016-10-27 ENCOUNTER — Ambulatory Visit: Payer: Self-pay | Admitting: Pharmacy Technician

## 2016-10-28 NOTE — Progress Notes (Signed)
Patient scheduled for eligibility appointment at [facility].  Patient did not show for the appointment on 10/27/16 at 2:00p.m.  Patient did not reschedule eligibility appointment.  Attempted to contact patient regarding no show.  Received voice mail.  Left message.  Sherilyn DacostaBetty J. Lajada Janes Care Manager [facility]

## 2016-11-03 ENCOUNTER — Ambulatory Visit: Payer: Self-pay | Attending: Family | Admitting: Family

## 2016-11-03 ENCOUNTER — Encounter: Payer: Self-pay | Admitting: Family

## 2016-11-03 VITALS — BP 169/88 | HR 84 | Resp 18 | Ht 66.0 in | Wt 219.0 lb

## 2016-11-03 DIAGNOSIS — F17210 Nicotine dependence, cigarettes, uncomplicated: Secondary | ICD-10-CM | POA: Insufficient documentation

## 2016-11-03 DIAGNOSIS — Z720 Tobacco use: Secondary | ICD-10-CM

## 2016-11-03 DIAGNOSIS — E785 Hyperlipidemia, unspecified: Secondary | ICD-10-CM | POA: Insufficient documentation

## 2016-11-03 DIAGNOSIS — I110 Hypertensive heart disease with heart failure: Secondary | ICD-10-CM | POA: Insufficient documentation

## 2016-11-03 DIAGNOSIS — I10 Essential (primary) hypertension: Secondary | ICD-10-CM

## 2016-11-03 DIAGNOSIS — J45909 Unspecified asthma, uncomplicated: Secondary | ICD-10-CM | POA: Insufficient documentation

## 2016-11-03 DIAGNOSIS — I5032 Chronic diastolic (congestive) heart failure: Secondary | ICD-10-CM | POA: Insufficient documentation

## 2016-11-03 DIAGNOSIS — Z79899 Other long term (current) drug therapy: Secondary | ICD-10-CM | POA: Insufficient documentation

## 2016-11-03 DIAGNOSIS — Z7982 Long term (current) use of aspirin: Secondary | ICD-10-CM | POA: Insufficient documentation

## 2016-11-03 MED ORDER — LISINOPRIL 10 MG PO TABS: 10.0000 mg | ORAL_TABLET | Freq: Every day | ORAL | 3 refills | Status: DC

## 2016-11-03 NOTE — Progress Notes (Signed)
Patient ID: Brent Davidson, male    DOB: 1955/11/04, 61 y.o.   MRN: [id number]  HPI Mr Hannen is a 61 y/o male with a history of HTN, hyperlipidemia, murmur, asthma, current tobacco use and chronic heart failure.  Last echo was done on 09/08/16 and showed an EF of 55-60% along with mild aortic stenosis and mild MR.   Most recently admitted on 09/07/16 with HF exacerbation and bronchitis. Was treated with IV steroids, nebulizers, inhalers, and discharged with prednisone taper and antibiotics. Was also given IV diuretics.   He presents today for his follow-up visit with fatigue and shortness of breath upon moderate exertion. Denies any swelling in his legs/abdomen. Already weighing himself daily. Does feel like he's sleeping well and wakes up feeling rested. Currently doesn't have any medical insurance and has been out of all his medications for the last 2 weeks.   Past Medical History:  Diagnosis Date  . Asthma   . CHF (congestive heart failure) (HCC)   . Heart murmur   . Hyperlipidemia   . Hypertension    No past surgical history on file.  Family History  Problem Relation Age of Onset  . CAD Mother   . CAD Father   . Diabetes Sister   . Kidney failure Sister    Social History  Substance Use Topics  . Smoking status: Current Every Day Smoker    Packs/day: 0.50  . Smokeless tobacco: Never Used  . Alcohol use No     Comment: occasional   No Known Allergies  Prior to Admission medications   Medication Sig Start Date End Date Taking? Authorizing Provider  aspirin 81 MG chewable tablet Chew 81 mg by mouth daily. 11/27/15 11/26/16 Yes Historical Provider, MD  Multiple Vitamin (MULTIVITAMIN) tablet Take 1 tablet by mouth daily.   Yes Historical Provider, MD  albuterol (PROVENTIL HFA;VENTOLIN HFA) 108 (90 Base) MCG/ACT inhaler Inhale 2 puffs into the lungs every 4 (four) hours as needed. Patient not taking: Reported on 11/03/2016 09/10/16   Alford Highland, MD  beclomethasone  (QVAR) 40 MCG/ACT inhaler Inhale 1 puff into the lungs 2 (two) times daily. Patient not taking: Reported on 11/03/2016 09/10/16   Alford Highland, MD  enalapril (VASOTEC) 10 MG tablet Take 1 tablet (10 mg total) by mouth 2 (two) times daily. Patient not taking: Reported on 11/03/2016 09/10/16   Alford Highland, MD  furosemide (LASIX) 40 MG tablet Take 1 tablet (40 mg total) by mouth daily. Patient not taking: Reported on 11/03/2016 09/10/16   Alford Highland, MD  metoprolol succinate (TOPROL-XL) 50 MG 24 hr tablet Take 1 tablet (50 mg total) by mouth daily. Patient not taking: Reported on 11/03/2016 09/10/16   Alford Highland, MD     Review of Systems  Constitutional: Positive for fatigue. Negative for appetite change.  HENT: Positive for congestion. Negative for postnasal drip and sore throat.   Eyes: Negative.   Respiratory: Positive for shortness of breath. Negative for cough.   Cardiovascular: Positive for chest pain (since being out of inhaler) and palpitations. Negative for leg swelling.  Gastrointestinal: Positive for abdominal distention. Negative for abdominal pain.  Endocrine: Negative.   Genitourinary: Negative.   Musculoskeletal: Positive for arthralgias (left knee). Negative for back pain.  Skin: Negative.   Allergic/Immunologic: Negative.   Hematological: Negative for adenopathy. Does not bruise/bleed easily.  Psychiatric/Behavioral: Positive for sleep disturbance (sleeping more often). Negative for dysphoric mood and suicidal ideas. The patient is not nervous/anxious.    Vitals:  11/03/16 1259  BP: (!) 169/88  Pulse: 84  Resp: 18  SpO2: 100%  Weight: 219 lb (99.3 kg)  Height: 5\' 6"  (1.676 m)   Wt Readings from Last 3 Encounters:  11/03/16 219 lb (99.3 kg)  09/24/16 215 lb 4 oz (97.6 kg)  09/10/16 213 lb 8 oz (96.8 kg)   Lab Results  Component Value Date   CREATININE 1.12 09/10/2016   CREATININE 1.09 09/08/2016   CREATININE 0.94 09/07/2016    Physical Exam   Constitutional: He is oriented to person, place, and time. He appears well-developed and well-nourished.  HENT:  Head: Normocephalic and atraumatic.  Eyes: Conjunctivae are normal. Pupils are equal, round, and reactive to light.  Neck: Normal range of motion. Neck supple. No JVD present.  Cardiovascular: Normal rate and regular rhythm.   Pulmonary/Chest: Effort normal. He has no wheezes. He has no rales.  Abdominal: Soft. He exhibits no distension. There is no tenderness.  Musculoskeletal: He exhibits no edema or tenderness.  Neurological: He is alert and oriented to person, place, and time.  Skin: Skin is warm and dry.  Psychiatric: He has a normal mood and affect. His behavior is normal. Thought content normal.  Nursing note and vitals reviewed.  Assessment & Plan:  1: Chronic heart failure with preserved ejection fraction- - NYHA class II - euvolemic - already weighing daily. Instructed to call for an overnight weight gain of >2 pounds or a weekly weight gain of >5 pounds. - not adding salt to his food. Reviewed the importance of following a 2000mg  sodium diet and written information was given to him about this.  - [facility] appointment as he didn't have gas money. Tried calling them multiple times but it always went to voicemail. Advised him to stop in there after leaving here today.   2: HTN- - BP elevated today but he's been out of all his medications for the last 2 weeks. He does have some money right now and is willing to buy lisinopril which is on the $4.00 list at [facility]. Sent in prescription for 10mg  daily - phone number to [facility] given to patient and instructed him to call so that they could mail him the paperwork.  3: Tobacco use- - patient says that a pack of cigarettes will now last him about 4 days - complete cessation discussed for 3 minutes with him  Return here in 2 weeks or sooner for any questions/problems before  then. Will check a BMP next visit.

## 2016-11-03 NOTE — Patient Instructions (Signed)
Continue weighing daily and call for an overnight weight gain of > 2 pounds or a weekly weight gain of >5 pounds.    Smoking Cessation Quitting smoking is important to your health and has many advantages. However, it is not always easy to quit since nicotine is a very addictive drug. Oftentimes, people try 3 times or more before being able to quit. This document explains the best ways for you to prepare to quit smoking. Quitting takes hard work and a lot of effort, but you can do it. ADVANTAGES OF QUITTING SMOKING  You will live longer, feel better, and live better.  Your body will feel the impact of quitting smoking almost immediately.  Within 20 minutes, blood pressure decreases. Your pulse returns to its normal level.  After 8 hours, carbon monoxide levels in the blood return to normal. Your oxygen level increases.  After 24 hours, the chance of having a heart attack starts to decrease. Your breath, hair, and body stop smelling like smoke.  After 48 hours, damaged nerve endings begin to recover. Your sense of taste and smell improve.  After 72 hours, the body is virtually free of nicotine. Your bronchial tubes relax and breathing becomes easier.  After 2 to 12 weeks, lungs can hold more air. Exercise becomes easier and circulation improves.  The risk of having a heart attack, stroke, cancer, or lung disease is greatly reduced.  After 1 year, the risk of coronary heart disease is cut in half.  After 5 years, the risk of stroke falls to the same as a nonsmoker.  After 10 years, the risk of lung cancer is cut in half and the risk of other cancers decreases significantly.  After 15 years, the risk of coronary heart disease drops, usually to the level of a nonsmoker.  If you are pregnant, quitting smoking will improve your chances of having a healthy baby.  The people you live with, especially any children, will be healthier.  You will have extra money to spend on things other  than cigarettes. QUESTIONS TO THINK ABOUT BEFORE ATTEMPTING TO QUIT You may want to talk about your answers with your health care provider.  Why do you want to quit?  If you tried to quit in the past, what helped and what did not?  What will be the most difficult situations for you after you quit? How will you plan to handle them?  Who can help you through the tough times? Your family? Friends? A health care provider?  What pleasures do you get from smoking? What ways can you still get pleasure if you quit? Here are some questions to ask your health care provider:  How can you help me to be successful at quitting?  What medicine do you think would be best for me and how should I take it?  What should I do if I need more help?  What is smoking withdrawal like? How can I get information on withdrawal? GET READY  Set a quit date.  Change your environment by getting rid of all cigarettes, ashtrays, matches, and lighters in your home, car, or work. Do not let people smoke in your home.  Review your past attempts to quit. Think about what worked and what did not. GET SUPPORT AND ENCOURAGEMENT You have a better chance of being successful if you have help. You can get support in many ways.  Tell your family, friends, and coworkers that you are going to quit and need their support. Ask   them not to smoke around you.  Get individual, group, or telephone counseling and support. Programs are available at local hospitals and health centers. Call your local health department for information about programs in your area.  Spiritual beliefs and practices may help some smokers quit.  Download a "quit meter" on your computer to keep track of quit statistics, such as how long you have gone without smoking, cigarettes not smoked, and money saved.  Get a self-help book about quitting smoking and staying off tobacco. LEARN NEW SKILLS AND BEHAVIORS  Distract yourself from urges to smoke. Talk to  someone, go for a walk, or occupy your time with a task.  Change your normal routine. Take a different route to work. Drink tea instead of coffee. Eat breakfast in a different place.  Reduce your stress. Take a hot bath, exercise, or read a book.  Plan something enjoyable to do every day. Reward yourself for not smoking.  Explore interactive web-based programs that specialize in helping you quit. GET MEDICINE AND USE IT CORRECTLY Medicines can help you stop smoking and decrease the urge to smoke. Combining medicine with the above behavioral methods and support can greatly increase your chances of successfully quitting smoking.  Nicotine replacement therapy helps deliver nicotine to your body without the negative effects and risks of smoking. Nicotine replacement therapy includes nicotine gum, lozenges, inhalers, nasal sprays, and skin patches. Some may be available over-the-counter and others require a prescription.  Antidepressant medicine helps people abstain from smoking, but how this works is unknown. This medicine is available by prescription.  Nicotinic receptor partial agonist medicine simulates the effect of nicotine in your brain. This medicine is available by prescription. Ask your health care provider for advice about which medicines to use and how to use them based on your health history. Your health care provider will tell you what side effects to look out for if you choose to be on a medicine or therapy. Carefully read the information on the package. Do not use any other product containing nicotine while using a nicotine replacement product.  RELAPSE OR DIFFICULT SITUATIONS Most relapses occur within the first 3 months after quitting. Do not be discouraged if you start smoking again. Remember, most people try several times before finally quitting. You may have symptoms of withdrawal because your body is used to nicotine. You may crave cigarettes, be irritable, feel very hungry, cough  often, get headaches, or have difficulty concentrating. The withdrawal symptoms are only temporary. They are strongest when you first quit, but they will go away within 10-14 days. To reduce the chances of relapse, try to:  Avoid drinking alcohol. Drinking lowers your chances of successfully quitting.  Reduce the amount of caffeine you consume. Once you quit smoking, the amount of caffeine in your body increases and can give you symptoms, such as a rapid heartbeat, sweating, and anxiety.  Avoid smokers because they can make you want to smoke.  Do not let weight gain distract you. Many smokers will gain weight when they quit, usually less than 10 pounds. Eat a healthy diet and stay active. You can always lose the weight gained after you quit.  Find ways to improve your mood other than smoking. FOR MORE INFORMATION  www.smokefree.gov  Document Released: 08/25/2001 Document Revised: 01/15/2014 Document Reviewed: 12/10/2011 [employer] Patient Information 2015 [employer]. This information is not intended to replace advice given to you by your health care provider. Make sure you discuss any questions you have with your   health care provider.  

## 2016-11-17 ENCOUNTER — Ambulatory Visit: Payer: Self-pay | Admitting: Family

## 2016-11-17 ENCOUNTER — Telehealth: Payer: Self-pay | Admitting: Family

## 2016-11-17 NOTE — Telephone Encounter (Signed)
Patient did not show for his [facility] appointment on 11/17/16. Will attempt to reschedule.

## 2016-12-01 ENCOUNTER — Other Ambulatory Visit: Payer: Self-pay | Admitting: Family

## 2016-12-01 MED ORDER — ALBUTEROL SULFATE HFA 108 (90 BASE) MCG/ACT IN AERS: 2.0000 | INHALATION_SPRAY | RESPIRATORY_TRACT | 3 refills | Status: AC | PRN

## 2016-12-01 MED ORDER — BECLOMETHASONE DIPROPIONATE 40 MCG/ACT IN AERS: 1.0000 | INHALATION_SPRAY | Freq: Two times a day (BID) | RESPIRATORY_TRACT | 3 refills | Status: DC

## 2016-12-01 MED ORDER — FUROSEMIDE 40 MG PO TABS: 40.0000 mg | ORAL_TABLET | Freq: Every day | ORAL | 3 refills | Status: AC

## 2016-12-02 ENCOUNTER — Other Ambulatory Visit: Payer: Self-pay | Admitting: Family

## 2016-12-02 MED ORDER — BECLOMETHASONE DIPROP HFA 40 MCG/ACT IN AERB: 1.0000 | INHALATION_SPRAY | Freq: Two times a day (BID) | RESPIRATORY_TRACT | 5 refills | Status: AC

## 2016-12-02 NOTE — Progress Notes (Signed)
Pharmacy sent a message that QVAR was now only available as a redihaler so a new prescription was sent in for that.

## 2016-12-10 ENCOUNTER — Encounter: Payer: Self-pay | Admitting: Family

## 2016-12-10 ENCOUNTER — Ambulatory Visit: Payer: Self-pay | Attending: Family | Admitting: Family

## 2016-12-10 VITALS — BP 151/66 | HR 91 | Resp 18 | Ht 66.0 in | Wt 213.5 lb

## 2016-12-10 DIAGNOSIS — F17210 Nicotine dependence, cigarettes, uncomplicated: Secondary | ICD-10-CM | POA: Insufficient documentation

## 2016-12-10 DIAGNOSIS — Z79899 Other long term (current) drug therapy: Secondary | ICD-10-CM | POA: Insufficient documentation

## 2016-12-10 DIAGNOSIS — R000 Tachycardia, unspecified: Secondary | ICD-10-CM | POA: Insufficient documentation

## 2016-12-10 DIAGNOSIS — I5032 Chronic diastolic (congestive) heart failure: Secondary | ICD-10-CM | POA: Insufficient documentation

## 2016-12-10 DIAGNOSIS — I10 Essential (primary) hypertension: Secondary | ICD-10-CM

## 2016-12-10 DIAGNOSIS — Z720 Tobacco use: Secondary | ICD-10-CM

## 2016-12-10 DIAGNOSIS — J45909 Unspecified asthma, uncomplicated: Secondary | ICD-10-CM | POA: Insufficient documentation

## 2016-12-10 DIAGNOSIS — I110 Hypertensive heart disease with heart failure: Secondary | ICD-10-CM | POA: Insufficient documentation

## 2016-12-10 DIAGNOSIS — E785 Hyperlipidemia, unspecified: Secondary | ICD-10-CM | POA: Insufficient documentation

## 2016-12-10 MED ORDER — METOPROLOL TARTRATE 25 MG PO TABS: 25.0000 mg | ORAL_TABLET | Freq: Two times a day (BID) | ORAL | 5 refills | Status: AC

## 2016-12-10 MED ORDER — LISINOPRIL 10 MG PO TABS: 10.0000 mg | ORAL_TABLET | Freq: Every day | ORAL | 5 refills | Status: AC

## 2016-12-10 NOTE — Progress Notes (Signed)
Patient ID: Brent Davidson, male    DOB: [birth year], 61 y.o.   MRN: [id number]  HPI  [name] is a 61 y/o male with a history of HTN, hyperlipidemia, murmur, asthma, current tobacco use and chronic heart failure.  Last echo was done on 09/08/16 and showed an EF of 55-60% along with mild aortic stenosis and mild MR.   Most recently admitted on 09/07/16 with HF exacerbation and bronchitis. Was treated with IV steroids, nebulizers, inhalers, and discharged with prednisone taper and antibiotics. Was also given IV diuretics.   He presents today for his follow-up visit with fatigue upon moderate exertion. Denies any shortness of breath or swelling in his legs/abdomen. Has been noticing some palpitations. Already weighing himself daily. Does feel like he's sleeping well and wakes up feeling rested. Currently doesn't have any medical insurance and has been out of some medications for the last 4 days. Has not gotten [facility] or [facility] yet.   Past Medical History:  Diagnosis Date  . Asthma   . CHF (congestive heart failure) (HCC)   . Heart murmur   . Hyperlipidemia   . Hypertension    No past surgical history on file. Family History  Problem Relation Age of Onset  . CAD Mother   . CAD Father   . Diabetes Sister   . Kidney failure Sister    Social History  Substance Use Topics  . Smoking status: Current Every Day Smoker    Packs/day: 0.50  . Smokeless tobacco: Never Used  . Alcohol use No     Comment: occasional   No Known Allergies Prior to Admission medications   Medication Sig Start Date End Date Taking? Authorizing Provider  furosemide (LASIX) 40 MG tablet Take 1 tablet (40 mg total) by mouth daily. 12/01/16  Yes Delma Freezeina A Jakavion Bilodeau, FNP  Multiple Vitamin (MULTIVITAMIN) tablet Take 1 tablet by mouth daily.   Yes Historical Provider, MD  albuterol (PROVENTIL HFA;VENTOLIN HFA) 108 (90 Base) MCG/ACT inhaler Inhale 2 puffs into the lungs  every 4 (four) hours as needed. Patient not taking: Reported on 12/10/2016 12/01/16   Delma Freezeina A Concepcion Kirkpatrick, FNP  Beclomethasone Diprop HFA (QVAR REDIHALER) 40 MCG/ACT AERB Inhale 1 puff into the lungs 2 (two) times daily. Patient not taking: Reported on 12/10/2016 12/02/16   Delma Freezeina A Baeleigh Devincent, FNP  lisinopril (PRINIVIL,ZESTRIL) 10 MG tablet Take 1 tablet (10 mg total) by mouth daily. 11/03/16 12/03/16  Delma Freezeina A Charbel Los, FNP  metoprolol succinate (TOPROL-XL) 50 MG 24 hr tablet Take 1 tablet (50 mg total) by mouth daily. Patient not taking: Reported on 11/03/2016 09/10/16   Alford Highlandichard Wieting, MD     Review of Systems  Constitutional: Positive for fatigue. Negative for appetite change.  HENT: Positive for rhinorrhea. Negative for congestion and sore throat.   Eyes: Negative.   Respiratory: Positive for wheezing. Negative for cough, chest tightness and shortness of breath.   Cardiovascular: Positive for palpitations. Negative for chest pain and leg swelling.  Gastrointestinal: Negative for abdominal distention and abdominal pain.  Endocrine: Negative.   Genitourinary: Negative.   Musculoskeletal: Positive for arthralgias (left knee). Negative for back pain.  Skin: Negative.   Allergic/Immunologic: Negative.   Neurological: Negative for dizziness and light-headedness.  Hematological: Negative for adenopathy. Does not bruise/bleed easily.  Psychiatric/Behavioral: Negative for dysphoric mood, sleep disturbance (sleeping on 1 pillow) and suicidal ideas. The patient is not nervous/anxious.    Vitals:   12/10/16 1327  BP: (!) 151/66  Pulse: 91  Resp: 18  SpO2: 99%  Weight: 213 lb 8 oz (96.8 kg)  Height: 5\' 6"  (1.676 m)   Wt Readings from Last 3 Encounters:  12/10/16 213 lb 8 oz (96.8 kg)  11/03/16 219 lb (99.3 kg)  09/24/16 215 lb 4 oz (97.6 kg)   Lab Results  Component Value Date   CREATININE 1.12 09/10/2016   CREATININE 1.09 09/08/2016   CREATININE 0.94 09/07/2016    Physical Exam   Constitutional: He is oriented to person, place, and time. He appears well-developed and well-nourished.  HENT:  Head: Normocephalic and atraumatic.  Eyes: Conjunctivae are normal. Pupils are equal, round, and reactive to light.  Neck: Normal range of motion. Neck supple. No JVD present.  Cardiovascular: An irregular rhythm present. Tachycardia present.   Pulmonary/Chest: Effort normal. He has wheezes in the right upper field, the right lower field, the left upper field and the left lower field. He has no rales.  Abdominal: Soft. He exhibits no distension. There is no tenderness.  Musculoskeletal: He exhibits no edema or tenderness.  Neurological: He is alert and oriented to person, place, and time.  Skin: Skin is warm and dry.  Psychiatric: He has a normal mood and affect. His behavior is normal. Thought content normal.  Nursing note and vitals reviewed.   Assessment & Plan:  1: Chronic heart failure with preserved ejection fraction- - NYHA class II - euvolemic - already weighing daily. Instructed to call for an overnight weight gain of >2 pounds or a weekly weight gain of >5 pounds. - not adding salt to his food. Reviewed the importance of following a 2000mg  sodium diet. - has not gotten [facility] with [facility]. Emphasized the importance of getting his paperwork together and get an appointment made with them.   2: HTN- - BP elevated today but he's been out of lisinopril for the last 4 days. Is picking it up today - emphasized the importance of also getting established [facility]   3: Tachycardia- - patient says that he hasn't been taking metoprolol tartrate - new prescription sent in for 25mg  twice daily  4: Tobacco use- - patient says that a pack of cigarettes will now last him about 4 days - complete cessation discussed for 3 minutes with him - wheezing heard throughout but he's unable to get any of his inhalers - again, discussed the  importance of getting paperwork together so he can established with above clinics  Patient did not bring his medications nor a list. Each medication was verbally reviewed with the patient and he was encouraged to bring the bottles to every visit to confirm accuracy of list.  Return here in 1 month or sooner for any questions/problems before then.

## 2016-12-10 NOTE — Patient Instructions (Addendum)
Continue weighing daily and call for an overnight weight gain of > 2 pounds or a weekly weight gain of >5 pounds.    Smoking Cessation Quitting smoking is important to your health and has many advantages. However, it is not always easy to quit since nicotine is a very addictive drug. Oftentimes, people try 3 times or more before being able to quit. This document explains the best ways for you to prepare to quit smoking. Quitting takes hard work and a lot of effort, but you can do it. ADVANTAGES OF QUITTING SMOKING  You will live longer, feel better, and live better.  Your body will feel the impact of quitting smoking almost immediately.  Within 20 minutes, blood pressure decreases. Your pulse returns to its normal level.  After 8 hours, carbon monoxide levels in the blood return to normal. Your oxygen level increases.  After 24 hours, the chance of having a heart attack starts to decrease. Your breath, hair, and body stop smelling like smoke.  After 48 hours, damaged nerve endings begin to recover. Your sense of taste and smell improve.  After 72 hours, the body is virtually free of nicotine. Your bronchial tubes relax and breathing becomes easier.  After 2 to 12 weeks, lungs can hold more air. Exercise becomes easier and circulation improves.  The risk of having a heart attack, stroke, cancer, or lung disease is greatly reduced.  After 1 year, the risk of coronary heart disease is cut in half.  After 5 years, the risk of stroke falls to the same as a nonsmoker.  After 10 years, the risk of lung cancer is cut in half and the risk of other cancers decreases significantly.  After 15 years, the risk of coronary heart disease drops, usually to the level of a nonsmoker.  If you are pregnant, quitting smoking will improve your chances of having a healthy baby.  The people you live with, especially any children, will be healthier.  You will have extra money to spend on things other  than cigarettes. QUESTIONS TO THINK ABOUT BEFORE ATTEMPTING TO QUIT You may want to talk about your answers with your health care provider.  Why do you want to quit?  If you tried to quit in the past, what helped and what did not?  What will be the most difficult situations for you after you quit? How will you plan to handle them?  Who can help you through the tough times? Your family? Friends? A health care provider?  What pleasures do you get from smoking? What ways can you still get pleasure if you quit? Here are some questions to ask your health care provider:  How can you help me to be successful at quitting?  What medicine do you think would be best for me and how should I take it?  What should I do if I need more help?  What is smoking withdrawal like? How can I get information on withdrawal? GET READY  Set a quit date.  Change your environment by getting rid of all cigarettes, ashtrays, matches, and lighters in your home, car, or work. Do not let people smoke in your home.  Review your past attempts to quit. Think about what worked and what did not. GET SUPPORT AND ENCOURAGEMENT You have a better chance of being successful if you have help. You can get support in many ways.  Tell your family, friends, and coworkers that you are going to quit and need their support. Ask   them not to smoke around you.  Get individual, group, or telephone counseling and support. Programs are available at local hospitals and health centers. Call your local health department for information about programs in your area.  Spiritual beliefs and practices may help some smokers quit.  Download a "quit meter" on your computer to keep track of quit statistics, such as how long you have gone without smoking, cigarettes not smoked, and money saved.  Get a self-help book about quitting smoking and staying off tobacco. LEARN NEW SKILLS AND BEHAVIORS  Distract yourself from urges to smoke. Talk to  someone, go for a walk, or occupy your time with a task.  Change your normal routine. Take a different route to work. Drink tea instead of coffee. Eat breakfast in a different place.  Reduce your stress. Take a hot bath, exercise, or read a book.  Plan something enjoyable to do every day. Reward yourself for not smoking.  Explore interactive web-based programs that specialize in helping you quit. GET MEDICINE AND USE IT CORRECTLY Medicines can help you stop smoking and decrease the urge to smoke. Combining medicine with the above behavioral methods and support can greatly increase your chances of successfully quitting smoking.  Nicotine replacement therapy helps deliver nicotine to your body without the negative effects and risks of smoking. Nicotine replacement therapy includes nicotine gum, lozenges, inhalers, nasal sprays, and skin patches. Some may be available over-the-counter and others require a prescription.  Antidepressant medicine helps people abstain from smoking, but how this works is unknown. This medicine is available by prescription.  Nicotinic receptor partial agonist medicine simulates the effect of nicotine in your brain. This medicine is available by prescription. Ask your health care provider for advice about which medicines to use and how to use them based on your health history. Your health care provider will tell you what side effects to look out for if you choose to be on a medicine or therapy. Carefully read the information on the package. Do not use any other product containing nicotine while using a nicotine replacement product.  RELAPSE OR DIFFICULT SITUATIONS Most relapses occur within the first 3 months after quitting. Do not be discouraged if you start smoking again. Remember, most people try several times before finally quitting. You may have symptoms of withdrawal because your body is used to nicotine. You may crave cigarettes, be irritable, feel very hungry, cough  often, get headaches, or have difficulty concentrating. The withdrawal symptoms are only temporary. They are strongest when you first quit, but they will go away within 10-14 days. To reduce the chances of relapse, try to:  Avoid drinking alcohol. Drinking lowers your chances of successfully quitting.  Reduce the amount of caffeine you consume. Once you quit smoking, the amount of caffeine in your body increases and can give you symptoms, such as a rapid heartbeat, sweating, and anxiety.  Avoid smokers because they can make you want to smoke.  Do not let weight gain distract you. Many smokers will gain weight when they quit, usually less than 10 pounds. Eat a healthy diet and stay active. You can always lose the weight gained after you quit.  Find ways to improve your mood other than smoking. FOR MORE INFORMATION  www.smokefree.gov  Document Released: 08/25/2001 Document Revised: 01/15/2014 Document Reviewed: 12/10/2011 [employer] Patient Information 2015 [employer]. This information is not intended to replace advice given to you by your health care provider. Make sure you discuss any questions you have with your   health care provider.  

## 2016-12-11 DIAGNOSIS — R000 Tachycardia, unspecified: Secondary | ICD-10-CM | POA: Insufficient documentation

## 2017-01-04 ENCOUNTER — Ambulatory Visit: Payer: Self-pay | Admitting: Family

## 2017-01-04 ENCOUNTER — Telehealth: Payer: Self-pay | Admitting: Family

## 2017-01-04 NOTE — Telephone Encounter (Signed)
Patient did not show for his [facility] appointment on 01/04/17. Will attempt to reschedule.  

## 2017-02-12 DEATH — deceased

## 2018-09-22 IMAGING — CR DG CHEST 2V
2 series · 2 of 2 positions shown · non-contrast
Comparison: 09/07/2016

CLINICAL DATA: CHF short of breath

EXAM:
CHEST  2 VIEW

[chest pa]
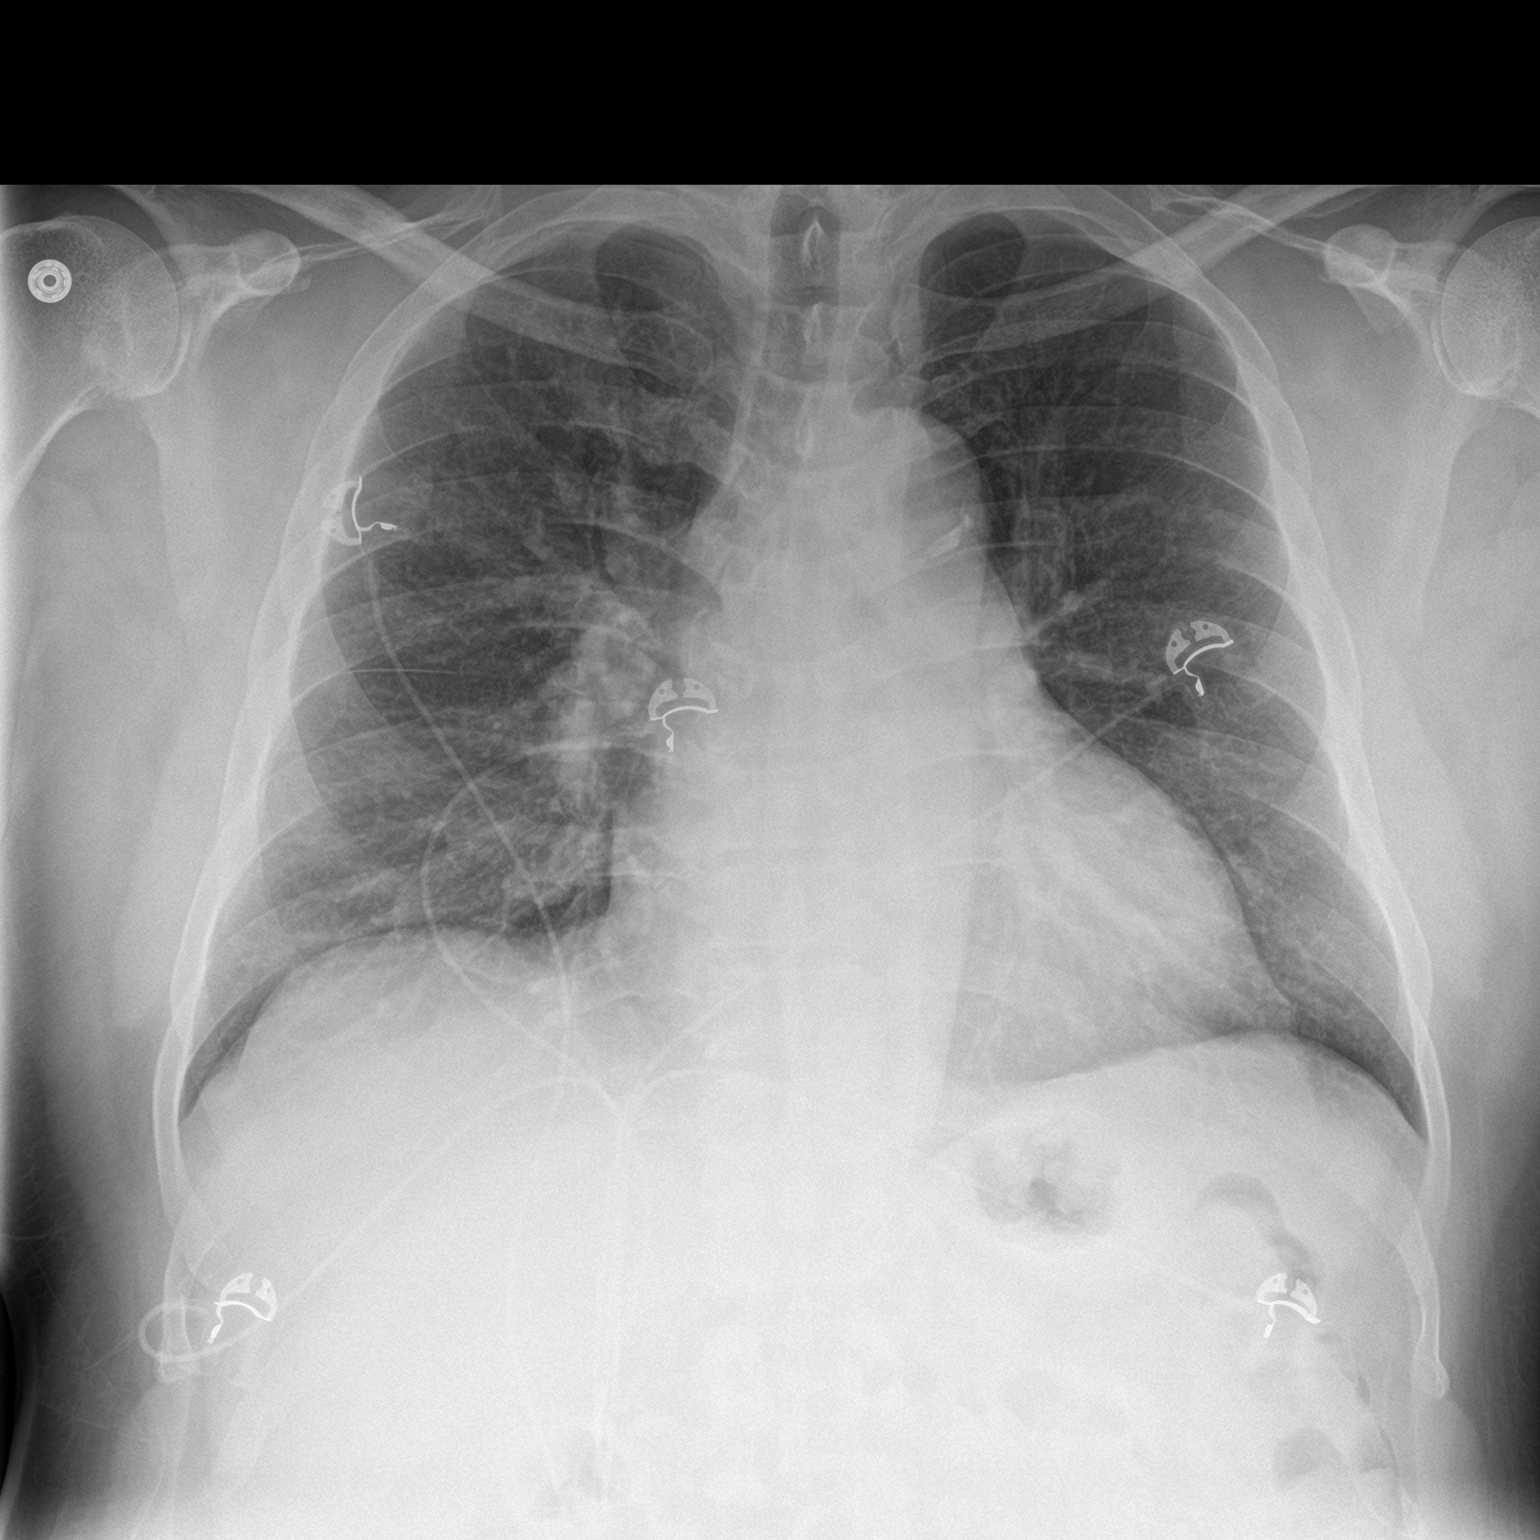

[chest lat]
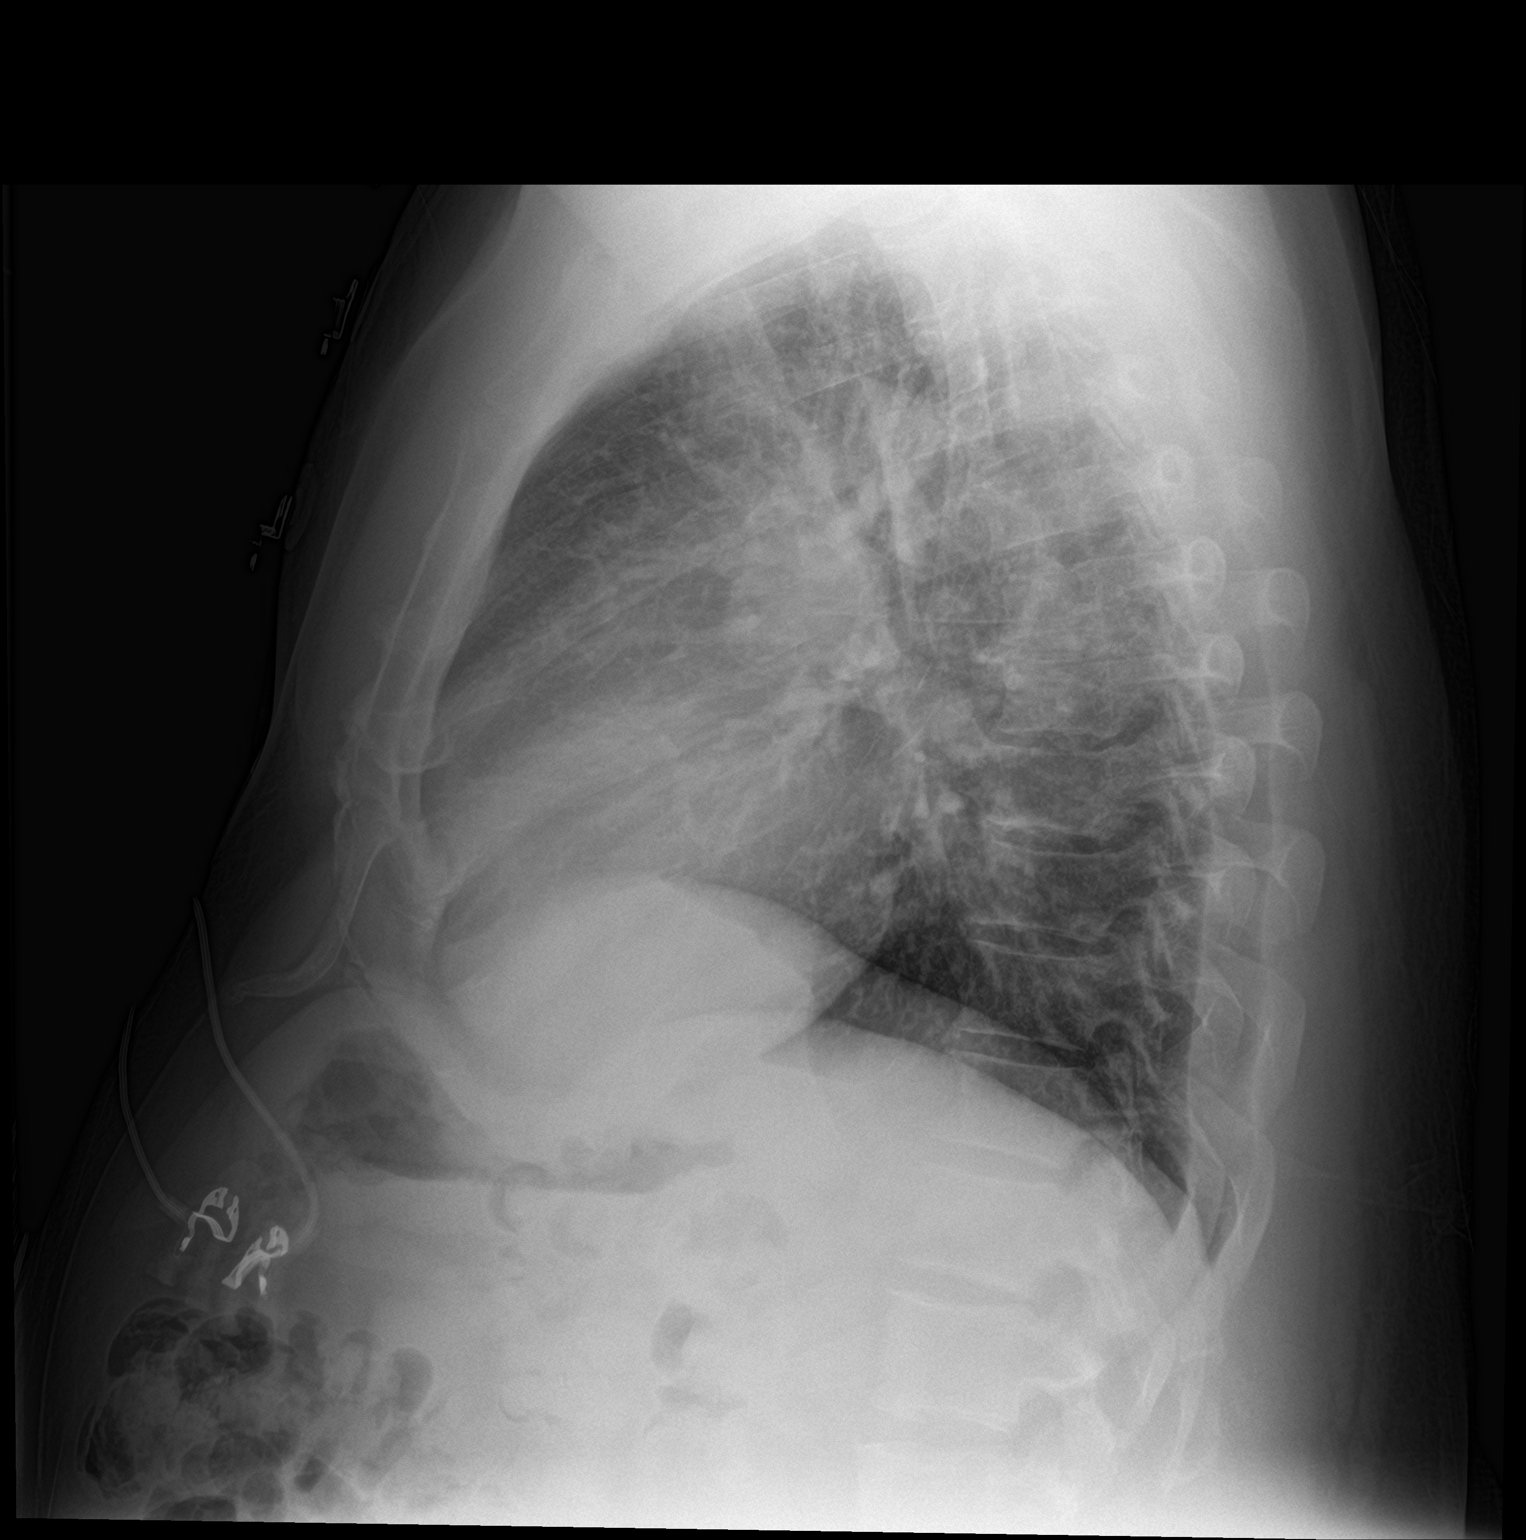

[2 of 2 positions shown; findings below may reference images not displayed]

FINDINGS: Improvement in vascular congestion and interstitial edema. No edema
or effusion on today's study. Improvement in bibasilar atelectasis.
IMPRESSION: Improvement in pulmonary interstitial edema. Improvement in
bibasilar atelectasis.
